# Patient Record
Sex: Female | Born: 1965 | Race: Black or African American | Hispanic: No | Smoking: Never smoker
Health system: Southern US, Community
[De-identification: ages and names within clinical notes are randomized; demographics above are authoritative.]

## PROBLEM LIST (undated history)

## (undated) DIAGNOSIS — D259 Leiomyoma of uterus, unspecified: Secondary | ICD-10-CM

## (undated) DIAGNOSIS — K7689 Other specified diseases of liver: Secondary | ICD-10-CM

## (undated) DIAGNOSIS — I10 Essential (primary) hypertension: Secondary | ICD-10-CM

## (undated) DIAGNOSIS — B009 Herpesviral infection, unspecified: Secondary | ICD-10-CM

## (undated) DIAGNOSIS — K449 Diaphragmatic hernia without obstruction or gangrene: Secondary | ICD-10-CM

## (undated) HISTORY — DX: Diaphragmatic hernia without obstruction or gangrene: K44.9

## (undated) HISTORY — DX: Other specified diseases of liver: K76.89

## (undated) HISTORY — PX: TUBAL LIGATION: SHX77

## (undated) HISTORY — DX: Leiomyoma of uterus, unspecified: D25.9

## (undated) HISTORY — PX: ABDOMINAL HYSTERECTOMY: SHX81

## (undated) HISTORY — DX: Herpesviral infection, unspecified: B00.9

---
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
## 1989-04-01 HISTORY — PX: CYST REMOVAL HAND: SHX6279

## 2005-07-14 ENCOUNTER — Emergency Department (HOSPITAL_COMMUNITY): Admission: EM | Admit: 2005-07-14 | Discharge: 2005-07-14 | Payer: Self-pay | Admitting: Emergency Medicine

## 2011-03-22 ENCOUNTER — Other Ambulatory Visit (HOSPITAL_COMMUNITY): Payer: Self-pay | Admitting: Family Medicine

## 2011-03-22 DIAGNOSIS — S199XXA Unspecified injury of neck, initial encounter: Secondary | ICD-10-CM

## 2011-04-05 ENCOUNTER — Ambulatory Visit (HOSPITAL_COMMUNITY)
Admission: RE | Admit: 2011-04-05 | Discharge: 2011-04-05 | Disposition: A | Discharge status: Home / Self Care | Payer: BC Managed Care – PPO | Source: Ambulatory Visit | Attending: Family Medicine | Admitting: Family Medicine

## 2011-04-05 DIAGNOSIS — S199XXA Unspecified injury of neck, initial encounter: Secondary | ICD-10-CM

## 2011-04-05 DIAGNOSIS — M542 Cervicalgia: Secondary | ICD-10-CM | POA: Insufficient documentation

## 2011-04-05 DIAGNOSIS — R209 Unspecified disturbances of skin sensation: Secondary | ICD-10-CM | POA: Insufficient documentation

## 2011-04-05 DIAGNOSIS — M5126 Other intervertebral disc displacement, lumbar region: Secondary | ICD-10-CM | POA: Insufficient documentation

## 2012-01-05 ENCOUNTER — Emergency Department (HOSPITAL_COMMUNITY)
Admission: EM | Admit: 2012-01-05 | Discharge: 2012-01-05 | Disposition: A | Discharge status: Home / Self Care | Payer: BC Managed Care – PPO | Attending: Emergency Medicine | Admitting: Emergency Medicine

## 2012-01-05 ENCOUNTER — Encounter (HOSPITAL_COMMUNITY): Payer: Self-pay | Admitting: Emergency Medicine

## 2012-01-05 ENCOUNTER — Emergency Department (HOSPITAL_COMMUNITY): Payer: BC Managed Care – PPO

## 2012-01-05 DIAGNOSIS — R079 Chest pain, unspecified: Secondary | ICD-10-CM | POA: Insufficient documentation

## 2012-01-05 DIAGNOSIS — Z79899 Other long term (current) drug therapy: Secondary | ICD-10-CM | POA: Insufficient documentation

## 2012-01-05 DIAGNOSIS — I10 Essential (primary) hypertension: Secondary | ICD-10-CM | POA: Insufficient documentation

## 2012-01-05 HISTORY — DX: Essential (primary) hypertension: I10

## 2012-01-05 LAB — CBC WITH DIFFERENTIAL/PLATELET
Basophils Absolute: 0 10*3/uL (ref 0.0–0.1)
Basophils Relative: 0 % (ref 0–1)
Eosinophils Absolute: 0 10*3/uL (ref 0.0–0.7)
Hemoglobin: 12.6 g/dL (ref 12.0–15.0)
MCH: 29.8 pg (ref 26.0–34.0)
MCHC: 36 g/dL (ref 30.0–36.0)
Monocytes Relative: 8 % (ref 3–12)
Neutro Abs: 6.2 10*3/uL (ref 1.7–7.7)
Neutrophils Relative %: 65 % (ref 43–77)
Platelets: 265 10*3/uL (ref 150–400)
RDW: 14.4 % (ref 11.5–15.5)

## 2012-01-05 LAB — COMPREHENSIVE METABOLIC PANEL
AST: 16 U/L (ref 0–37)
Albumin: 3.8 g/dL (ref 3.5–5.2)
Alkaline Phosphatase: 68 U/L (ref 39–117)
Chloride: 97 mEq/L (ref 96–112)
Potassium: 3.3 mEq/L — ABNORMAL LOW (ref 3.5–5.1)
Sodium: 134 mEq/L — ABNORMAL LOW (ref 135–145)
Total Bilirubin: 0.7 mg/dL (ref 0.3–1.2)
Total Protein: 7.8 g/dL (ref 6.0–8.3)

## 2012-01-05 LAB — D-DIMER, QUANTITATIVE: D-Dimer, Quant: 0.27 ug/mL-FEU (ref 0.00–0.48)

## 2012-01-05 MED ORDER — FAMOTIDINE 20 MG PO TABS: 20.0000 mg | ORAL_TABLET | Freq: Two times a day (BID) | ORAL | Status: DC

## 2012-01-05 NOTE — ED Provider Notes (Signed)
History       CSN: [id number]  Arrival date & time 01/05/12  6213   First MD Initiated Contact with Patient 01/05/12 [id number]      Chief Complaint  Patient presents with  . Chest Pain   History obtained directly from the patient, the patient states that she went to a class reunion last night, had eaten more food than usual, he did have some indigestion type pain in her chest which was mild and resolved spontaneously. She was awoken from sleep a short time ago with acute onset of mid chest pain, this was more intense than earlier in the evening, persistent, gradually improving until it had resolved and associated numbness of the right upper extremity. She had taken 2 aspirins prior to arrival and felt like his pain went away however she still has mild numbness of the right upper extremity. She does have a history of hypertension though she does not have diabetes cancer and does not smoke cigarettes. She has no history of coronary artery disease and states that she has seen a cardiologist twice in the past though she does not remember the specific tests that were performed. One time was for chest pain and one time was for a slow heart rate.  The patient denies history of venous thromboembolism, cancer, immobilization, travel, trauma, swelling, use of hormone therapy, leg swelling.  (Consider location/radiation/quality/duration/timing/severity/associated sxs/prior treatment) HPI  Past Medical History  Diagnosis Date  . Hypertension     History reviewed. No pertinent past surgical history.  History reviewed. No pertinent family history.  History  Substance Use Topics  . Smoking status: Never Smoker   . Smokeless tobacco: Not on file  . Alcohol Use: Yes     social     OB History    Grav Para Term Preterm Abortions TAB SAB Ect Mult Living                  Review of Systems  All other systems reviewed and are negative.    Allergies  Review of patient's allergies indicates no  known allergies.  Home Medications   Current Outpatient Rx  Name Route Sig Dispense Refill  . METOPROLOL TARTRATE 50 MG PO TABS Oral Take 50 mg by mouth 2 (two) times daily.      BP 142/89  Pulse 95  Temp 98.1 F (36.7 C) (Oral)  Resp 19  Ht 5\' 5"  (1.651 m)  Wt 200 lb (90.719 kg)  BMI 33.28 kg/m2  SpO2 97%  LMP 01/04/2012  Physical Exam  Nursing note and vitals reviewed. Constitutional: She appears well-developed and well-nourished. No distress.  HENT:  Head: Normocephalic and atraumatic.  Mouth/Throat: Oropharynx is clear and moist. No oropharyngeal exudate.  Eyes: Conjunctivae normal and EOM are normal. Pupils are equal, round, and reactive to light. Right eye exhibits no discharge. Left eye exhibits no discharge. No scleral icterus.  Neck: Normal range of motion. Neck supple. No JVD present. No thyromegaly present.  Cardiovascular: Normal rate, regular rhythm, normal heart sounds and intact distal pulses.  Exam reveals no gallop and no friction rub.   No murmur heard. Pulmonary/Chest: Effort normal and breath sounds normal. No respiratory distress. She has no wheezes. She has no rales.  Abdominal: Soft. Bowel sounds are normal. She exhibits no distension and no mass. There is no tenderness.  Musculoskeletal: Normal range of motion. She exhibits no edema and no tenderness.  Lymphadenopathy:    She has no cervical adenopathy.  Neurological: She is  alert. Coordination normal.       Normal sensation to light touch and pinprick of the right upper extremity, normal strength at the shoulder biceps forearm and grip of the right upper extremity. Gait is normal, speech is normal, memory is intact  Skin: Skin is warm and dry. No rash noted. No erythema.  Psychiatric: She has a normal mood and affect. Her behavior is normal.    ED Course  Procedures (including critical care time)  Labs Reviewed  CBC WITH DIFFERENTIAL - Abnormal; Notable for the following:    HCT 35.0 (*)     All  other components within normal limits  COMPREHENSIVE METABOLIC PANEL - Abnormal; Notable for the following:    Sodium 134 (*)     Potassium 3.3 (*)     All other components within normal limits  TROPONIN I  D-DIMER, QUANTITATIVE   Dg Chest 2 View  01/05/2012  *RADIOLOGY REPORT*  Clinical Data: Chest pain  CHEST - 2 VIEW  Comparison: None.  Findings: Lungs clear.  Heart size and pulmonary vascularity normal.  No effusion.  Visualized bones unremarkable.  IMPRESSION: No acute disease   Original Report Authenticated By: Thora Lance III, M.D.      1. Chest pain       MDM  Lungs and heart appear normal on exam, there is no signs of pulmonary embolism other than a slightly elevated heart rate of 100 on her EKG. There is no ischemic findings on EKG and she is pain-free. The pain was poorly described but also short lived and similar to the pain she had earlier in the evening after eating a large meal. Will obtain a d-dimer, labs, x-ray, reevaluate.  ED ECG REPORT  I personally interpreted this EKG   Date: 01/05/2012   Rate: 101  Rhythm: sinus tachycardia  QRS Axis: normal  Intervals: normal  ST/T Wave abnormalities: normal  Conduction Disutrbances:none  Narrative Interpretation:   Old EKG Reviewed: none available   The patient is asymptomatic, laboratory data reveals a normal d-dimer, normal troponin, normal blood counts and essentially normal electrolytes. Her chest x-ray is negative for any abnormal findings, the patient is low risk for both pulmonary embolism and acute coronary syndrome, suspect GI etiology, discharged home in improved condition.      Vida Roller, MD 01/05/12 [phone]

## 2012-01-05 NOTE — ED Notes (Signed)
Patient is comfortable at this time. 

## 2012-01-05 NOTE — ED Notes (Signed)
Patient states she does not need anything at this time. 

## 2012-01-05 NOTE — ED Notes (Signed)
Pt states chest pain x2 hours, states pain in right chest and numbness down right arm. Denies SOB or any other symptoms. Pt states to have taken 2 325mg  aspirins

## 2012-06-24 ENCOUNTER — Emergency Department (HOSPITAL_COMMUNITY): Payer: BC Managed Care – PPO

## 2012-06-24 ENCOUNTER — Inpatient Hospital Stay (HOSPITAL_COMMUNITY)
Admission: EM | Admit: 2012-06-24 | Discharge: 2012-06-26 | DRG: 179 | Disposition: A | Discharge status: Home / Self Care | Payer: BC Managed Care – PPO | Attending: Internal Medicine | Admitting: Internal Medicine

## 2012-06-24 ENCOUNTER — Encounter (HOSPITAL_COMMUNITY): Payer: Self-pay

## 2012-06-24 DIAGNOSIS — A0839 Other viral enteritis: Secondary | ICD-10-CM

## 2012-06-24 DIAGNOSIS — R1013 Epigastric pain: Secondary | ICD-10-CM

## 2012-06-24 DIAGNOSIS — R739 Hyperglycemia, unspecified: Secondary | ICD-10-CM | POA: Diagnosis present

## 2012-06-24 DIAGNOSIS — I10 Essential (primary) hypertension: Secondary | ICD-10-CM | POA: Diagnosis present

## 2012-06-24 DIAGNOSIS — K5000 Crohn's disease of small intestine without complications: Secondary | ICD-10-CM

## 2012-06-24 DIAGNOSIS — E876 Hypokalemia: Secondary | ICD-10-CM | POA: Diagnosis present

## 2012-06-24 DIAGNOSIS — E669 Obesity, unspecified: Secondary | ICD-10-CM

## 2012-06-24 DIAGNOSIS — R109 Unspecified abdominal pain: Secondary | ICD-10-CM | POA: Insufficient documentation

## 2012-06-24 DIAGNOSIS — B009 Herpesviral infection, unspecified: Secondary | ICD-10-CM | POA: Diagnosis present

## 2012-06-24 DIAGNOSIS — Z6833 Body mass index (BMI) 33.0-33.9, adult: Secondary | ICD-10-CM

## 2012-06-24 DIAGNOSIS — D72829 Elevated white blood cell count, unspecified: Secondary | ICD-10-CM | POA: Diagnosis present

## 2012-06-24 DIAGNOSIS — E162 Hypoglycemia, unspecified: Secondary | ICD-10-CM | POA: Diagnosis present

## 2012-06-24 DIAGNOSIS — N390 Urinary tract infection, site not specified: Secondary | ICD-10-CM | POA: Diagnosis present

## 2012-06-24 DIAGNOSIS — Z79899 Other long term (current) drug therapy: Secondary | ICD-10-CM

## 2012-06-24 LAB — COMPREHENSIVE METABOLIC PANEL
ALT: 10 U/L (ref 0–35)
AST: 13 U/L (ref 0–37)
AST: 12 U/L (ref 0–37)
Albumin: 3.7 g/dL (ref 3.5–5.2)
CO2: 26 mEq/L (ref 19–32)
Calcium: 9.3 mg/dL (ref 8.4–10.5)
Calcium: 9 mg/dL (ref 8.4–10.5)
Creatinine, Ser: 0.81 mg/dL (ref 0.50–1.10)
GFR calc non Af Amer: >90 mL/min (ref >90)
Sodium: 133 mEq/L — ABNORMAL LOW (ref 135–145)
Total Protein: 8 g/dL (ref 6.0–8.3)
Total Protein: 7.7 g/dL (ref 6.0–8.3)

## 2012-06-24 LAB — CBC WITH DIFFERENTIAL/PLATELET
Basophils Absolute: 0 10*3/uL (ref 0.0–0.1)
Basophils Relative: 0 % (ref 0–1)
Eosinophils Relative: 0 % (ref 0–5)
HCT: 35.9 % — ABNORMAL LOW (ref 36.0–46.0)
MCHC: 36.2 g/dL — ABNORMAL HIGH (ref 30.0–36.0)
MCV: 81.6 fL (ref 78.0–100.0)
Monocytes Absolute: 0.4 10*3/uL (ref 0.1–1.0)
RDW: 13.7 % (ref 11.5–15.5)

## 2012-06-24 LAB — URINALYSIS, ROUTINE W REFLEX MICROSCOPIC
Glucose, UA: NEGATIVE mg/dL
Protein, ur: NEGATIVE mg/dL
pH: 6.5 (ref 5.0–8.0)

## 2012-06-24 LAB — URINE MICROSCOPIC-ADD ON

## 2012-06-24 LAB — CBC
MCH: 28.6 pg (ref 26.0–34.0)
Platelets: 247 10*3/uL (ref 150–400)
RBC: 4.55 MIL/uL (ref 3.87–5.11)

## 2012-06-24 LAB — HEMOGLOBIN A1C: Hgb A1c MFr Bld: 5.2 % (ref <5.7)

## 2012-06-24 MED ORDER — CIPROFLOXACIN IN D5W 400 MG/200ML IV SOLN
400.0000 mg | Freq: Two times a day (BID) | INTRAVENOUS | Status: DC
  Administered 2012-06-24 – 2012-06-25 (×2): 400 mg via INTRAVENOUS
  Filled 2012-06-24 (×2): qty 200

## 2012-06-24 MED ORDER — SODIUM CHLORIDE 0.9 % IV SOLN: INTRAVENOUS | Status: DC

## 2012-06-24 MED ORDER — POTASSIUM CHLORIDE IN NACL 20-0.9 MEQ/L-% IV SOLN
INTRAVENOUS | Status: DC
  Administered 2012-06-24 – 2012-06-25 (×3): via INTRAVENOUS

## 2012-06-24 MED ORDER — HYDROMORPHONE HCL PF 1 MG/ML IJ SOLN
1.0000 mg | INTRAMUSCULAR | Status: DC | PRN
  Administered 2012-06-24: 1 mg via INTRAVENOUS
  Filled 2012-06-24: qty 1

## 2012-06-24 MED ORDER — SORBITOL 70 % SOLN: 30.0000 mL | Freq: Every day | Status: DC | PRN

## 2012-06-24 MED ORDER — ONDANSETRON HCL 4 MG/2ML IJ SOLN
4.0000 mg | Freq: Once | INTRAMUSCULAR | Status: AC
Start: 2012-06-24 — End: 2012-06-24
  Administered 2012-06-24: 4 mg via INTRAVENOUS

## 2012-06-24 MED ORDER — IOHEXOL 300 MG/ML  SOLN
100.0000 mL | Freq: Once | INTRAMUSCULAR | Status: AC | PRN
  Administered 2012-06-24: 100 mL via INTRAVENOUS

## 2012-06-24 MED ORDER — ONDANSETRON HCL 4 MG/2ML IJ SOLN
4.0000 mg | INTRAMUSCULAR | Status: DC | PRN
  Administered 2012-06-24 – 2012-06-25 (×4): 4 mg via INTRAVENOUS
  Filled 2012-06-24 (×4): qty 2

## 2012-06-24 MED ORDER — ONDANSETRON HCL 4 MG/2ML IJ SOLN
INTRAMUSCULAR | Status: AC
  Administered 2012-06-24: 4 mg via INTRAVENOUS
  Filled 2012-06-24: qty 2

## 2012-06-24 MED ORDER — MORPHINE SULFATE 4 MG/ML IJ SOLN
INTRAMUSCULAR | Status: AC
  Administered 2012-06-24: 4 mg via INTRAVENOUS
  Filled 2012-06-24: qty 1

## 2012-06-24 MED ORDER — ENOXAPARIN SODIUM 40 MG/0.4ML ~~LOC~~ SOLN
40.0000 mg | SUBCUTANEOUS | Status: DC
  Administered 2012-06-24 – 2012-06-26 (×3): 40 mg via SUBCUTANEOUS
  Filled 2012-06-24 (×3): qty 0.4

## 2012-06-24 MED ORDER — ACETAMINOPHEN 325 MG PO TABS
650.0000 mg | ORAL_TABLET | ORAL | Status: DC | PRN
  Administered 2012-06-25: 650 mg via ORAL
  Filled 2012-06-24: qty 2

## 2012-06-24 MED ORDER — METRONIDAZOLE IN NACL 5-0.79 MG/ML-% IV SOLN
500.0000 mg | Freq: Once | INTRAVENOUS | Status: AC
  Administered 2012-06-24: 500 mg via INTRAVENOUS
  Filled 2012-06-24: qty 100

## 2012-06-24 MED ORDER — FLEET ENEMA 7-19 GM/118ML RE ENEM: 1.0000 | ENEMA | Freq: Once | RECTAL | Status: AC | PRN

## 2012-06-24 MED ORDER — CIPROFLOXACIN IN D5W 400 MG/200ML IV SOLN
400.0000 mg | Freq: Once | INTRAVENOUS | Status: DC
  Administered 2012-06-24: 400 mg via INTRAVENOUS
  Filled 2012-06-24: qty 200

## 2012-06-24 MED ORDER — METOPROLOL TARTRATE 50 MG PO TABS
50.0000 mg | ORAL_TABLET | Freq: Two times a day (BID) | ORAL | Status: DC
  Administered 2012-06-24 – 2012-06-26 (×5): 50 mg via ORAL
  Filled 2012-06-24 (×5): qty 1

## 2012-06-24 MED ORDER — HYDROMORPHONE HCL PF 1 MG/ML IJ SOLN: 1.0000 mg | INTRAMUSCULAR | Status: DC | PRN

## 2012-06-24 MED ORDER — VALACYCLOVIR HCL 500 MG PO TABS
500.0000 mg | ORAL_TABLET | Freq: Two times a day (BID) | ORAL | Status: DC
  Administered 2012-06-24 – 2012-06-26 (×4): 500 mg via ORAL
  Filled 2012-06-24 (×6): qty 1

## 2012-06-24 MED ORDER — HYDROMORPHONE HCL PF 1 MG/ML IJ SOLN
0.5000 mg | INTRAMUSCULAR | Status: DC | PRN
  Administered 2012-06-24 – 2012-06-25 (×3): 0.5 mg via INTRAVENOUS
  Filled 2012-06-24 (×3): qty 1

## 2012-06-24 MED ORDER — IOHEXOL 300 MG/ML  SOLN
50.0000 mL | Freq: Once | INTRAMUSCULAR | Status: AC | PRN
  Administered 2012-06-24: 50 mL via ORAL

## 2012-06-24 MED ORDER — METRONIDAZOLE IN NACL 5-0.79 MG/ML-% IV SOLN
500.0000 mg | Freq: Three times a day (TID) | INTRAVENOUS | Status: DC
  Administered 2012-06-24 – 2012-06-25 (×4): 500 mg via INTRAVENOUS
  Filled 2012-06-24 (×4): qty 100

## 2012-06-24 MED ORDER — MORPHINE SULFATE 4 MG/ML IJ SOLN
4.0000 mg | Freq: Once | INTRAMUSCULAR | Status: AC
  Administered 2012-06-24: 4 mg via INTRAVENOUS

## 2012-06-24 MED ORDER — PANTOPRAZOLE SODIUM 40 MG PO TBEC
40.0000 mg | DELAYED_RELEASE_TABLET | Freq: Two times a day (BID) | ORAL | Status: DC
  Administered 2012-06-24 – 2012-06-26 (×5): 40 mg via ORAL
  Filled 2012-06-24 (×5): qty 1

## 2012-06-24 MED ORDER — ONDANSETRON HCL 4 MG/2ML IJ SOLN: 4.0000 mg | Freq: Three times a day (TID) | INTRAMUSCULAR | Status: DC | PRN

## 2012-06-24 MED ORDER — TRAZODONE HCL 50 MG PO TABS
50.0000 mg | ORAL_TABLET | Freq: Every evening | ORAL | Status: DC | PRN
  Administered 2012-06-25: 50 mg via ORAL
  Filled 2012-06-24: qty 1

## 2012-06-24 NOTE — Progress Notes (Signed)
Chart reviewed.  Subjective: Patient reports that pain continues, but that the pain medication and is close to strong. She vomited earlier today. Anti-emetic has helped. No stool. Feels bloated.  Objective: Vital signs in last 24 hours: Filed Vitals:   06/24/12 0500 06/24/12 0524 06/24/12 1000 06/24/12 1341  BP:  118/79 120/78 127/86  Pulse:  96 96 96  Temp:  98.9 F (37.2 C) 98.3 F (36.8 C) 98.5 F (36.9 C)  TempSrc:  Oral Oral Oral  Resp:  20 20 20   Height:  5\' 5"  (1.651 m)    Weight: 93.1 kg (205 lb 4 oz) 93.1 kg (205 lb 4 oz)    SpO2:  96% 98% 99%   Weight change:   Intake/Output Summary (Last 24 hours) at 06/24/12 1427 Last data filed at 06/24/12 1300  Gross per 24 hour  Intake  592.5 ml  Output      0 ml  Net  592.5 ml   Gen.: Groggy. Answers questions but is back to sleep easily. Lungs clear to auscultation bilaterally without wheeze rhonchi or rales Cardiovascular regular rate rhythm without murmurs gallops rubs Abdomen distended, soft, mildly tender Extremities no clubbing cyanosis or edema  Lab Results: Basic Metabolic Panel:  Recent Labs Lab 06/24/12 0057 06/24/12 0547  NA 135 133*  K 2.8* 3.5  CL 97 96  CO2 28 26  GLUCOSE 159* 142*  BUN 11 9  CREATININE 0.81 0.75  CALCIUM 9.3 9.0   Liver Function Tests:  Recent Labs Lab 06/24/12 0057 06/24/12 0547  AST 13 12  ALT 11 10  ALKPHOS 70 63  BILITOT 0.5 1.0  PROT 8.0 7.7  ALBUMIN 3.7 3.6    Recent Labs Lab 06/24/12 0057  LIPASE 14   No results found for this basename: AMMONIA,  in the last 168 hours CBC:  Recent Labs Lab 06/24/12 0057 06/24/12 0547  WBC 10.6* 11.5*  NEUTROABS 7.5  --   HGB 13.0 13.0  HCT 35.9* 37.1  MCV 81.6 81.5  PLT 265 247   Urinalysis:  Recent Labs Lab 06/24/12 0335  COLORURINE YELLOW  LABSPEC <1.005*  PHURINE 6.5  GLUCOSEU NEGATIVE  HGBUR MODERATE*  BILIRUBINUR NEGATIVE  KETONESUR NEGATIVE  PROTEINUR NEGATIVE  UROBILINOGEN 0.2  NITRITE  NEGATIVE  LEUKOCYTESUR TRACE*   Micro Results: Recent Results (from the past 240 hour(s))  URINE CULTURE     Status: None   Collection Time    06/24/12  3:35 AM      Result Value Range Status   Specimen Description URINE, RANDOM   Final   Special Requests NONE   Final   Colony Count PENDING   Incomplete   Culture PENDING   Incomplete   Report Status PENDING   Incomplete   Studies/Results: Ct Abdomen Pelvis W Contrast  06/24/2012  *RADIOLOGY REPORT*  Clinical Data: Generalized abdominal pain for 2 days.  CT ABDOMEN AND PELVIS WITH CONTRAST  Technique:  Multidetector CT imaging of the abdomen and pelvis was performed following the standard protocol during bolus administration of intravenous contrast.  Contrast: 50mL OMNIPAQUE IOHEXOL 300 MG/ML  SOLN, OMNIPAQUE IOHEXOL 300 MG/ML  SOLN  Comparison: None.  Findings: Lung Bases: Dependent atelectasis.  Liver:  Focal fatty infiltration adjacent to the falciform ligament.  Tiny low density lesion in the inferior right hepatic lobe, probably representing a tiny cyst.  Scattered other low attenuation areas are present.  Spleen:  Normal.  Gallbladder:  Normal.  Common bile duct:  Normal.  Pancreas:  Normal.  Adrenal glands:  Normal.  Kidneys:  Normal enhancement.  Normal delayed excretion of contrast.  Both ureters appear within normal limits.  Stomach:  Patulous distal thoracic esophagus.  Tiny hiatal hernia. No inflammatory changes of the stomach.  Small bowel:  Duodenum appears normal.  There is no small bowel obstruction proximally.  Small bowel is opacified with oral contrast to the level of the jejunum.  The ileum is fluid filled and demonstrates mucosal hyperenhancement, mild mesenteric fat stranding and borderline mural thickening.  The findings compatible with enteritis although this is nonspecific.  This can be associated with inflammatory bowel disease or enteric infection. Small amount of interloop free fluid is present.  Colon:   The  appendix is identified in the right lower quadrant. Proximal appendix has a air centrally.  The tip of the appendix is poorly visualized and terminates adjacent to the right ovary and loops of small bowel in the right lower quadrant (image number 57 series 2).  Acute appendicitis is considered unlikely.  There is however, difficult to completely exclude.  The ascending colon appears normal.  Transverse and descending colon normal.  Small amount of free fluid is present in the anatomic pelvis around the rectosigmoid.  Pelvic Genitourinary:  Urinary bladder appears normal.  Uterus and adnexa grossly appear within normal limits.  Possible small anterior fundal fibroid.  Bones:  No aggressive osseous lesions.  Vasculature: Normal opacification.  No acute vascular abnormality.  IMPRESSION: 1.  Enteritis of the terminal ileum.  Findings suggest either enteric infection or inflammatory bowel disease/Crohn's disease. No colonic involvement. 2.  Appendix appears normal proximally.  The distal tip is difficult to visualize.  Acute appendicitis is considered unlikely based on the appearance.  Repeat scanning in 8 hours could offer further characterization of distal small bowel and better define the appendix once oral contrast has progressed further. 3.  Tiny low density hepatic lesions likely representing cysts. 4.  Small hiatal hernia and patulous distal thoracic esophagus.   Original Report Authenticated By: Andreas Newport, M.D.    Scheduled Meds: . ciprofloxacin  400 mg Intravenous Q12H  . enoxaparin (LOVENOX) injection  40 mg Subcutaneous Q24H  . metoprolol  50 mg Oral BID  . metronidazole  500 mg Intravenous Q8H  . pantoprazole  40 mg Oral BID AC   Continuous Infusions: . 0.9 % NaCl with KCl 20 mEq / Craig 150 mL/hr at 06/24/12 0457   PRN Meds:.acetaminophen, HYDROmorphone (DILAUDID) injection, ondansetron (ZOFRAN) IV, sodium phosphate, sorbitol, traZODone Assessment/Plan: Principal Problem:   Ileitis,  terminal: Continue empiric Cipro and Flagyl. Will likely need colonoscopy. Await GIs recommendations. Patient reports that the pain medication is too sedating and would like to try cutting her dose back. Will decrease Dilaudid to 0.5 mg Active Problems:   Hypokalemia corrected   Essential hypertension, benign   UTI (urinary tract infection): Doubt much of a contributor here.   Obesity, unspecified   Hyperglycemia   LOS: 0 days   Christy Craig 06/24/2012, 2:27 PM

## 2012-06-24 NOTE — Care Management Note (Signed)
    Page 1 of 1   06/26/2012     1:30:20 PM   CARE MANAGEMENT NOTE 06/26/2012  Patient:  Christy Craig, Christy Craig   Account Number:  [id number]  Date Initiated:  06/24/2012  Documentation initiated by:  Rosemary Holms  Subjective/Objective Assessment:   Pt lives at home with a 16 and 45 yo. Works for [employer] and recieves medical care there. No HH needs identified or anticipated.     Action/Plan:   Anticipated DC Date:  06/26/2012   Anticipated DC Plan:  HOME/SELF CARE         Choice offered to / List presented to:             Status of service:  Completed, signed off Medicare Important Message given?   (If response is "NO", the following Medicare IM given date fields will be blank) Date Medicare IM given:   Date Additional Medicare IM given:    Discharge Disposition:    Per UR Regulation:    If discussed at Long Length of Stay Meetings, dates discussed:    Comments:  06/24/12 Rosemary Holms RN BSN CM

## 2012-06-24 NOTE — Consult Note (Signed)
Reason for Consult:abdominal pain Referring Physician: Hospitalist Services  Christy Craig is an 47 y.o. female.  HPI: Admitted last night thru the ED with abdominal pain. She was brought in by EMS. She says the pain was unbearable.  Abdominal pain started Sunday afternoon after eating [employer]. She actually thought she had "gas"  Pains. She c/o this am of epigastric and lower abdominal pain. There has been no fever. Shehad nausea and vomiting last night. She tells me her BMs have been normal. No melena or bright red rectal bleeding. Appetite is okay. She c/o of a burning sensation when she eats. At home, she takes Omeprazole on a prn basis for acid reflux.  She denies prior hx of this type of abdominal pain. No family hx of Crohn's disease. No hx of prior colonoscopy or EGD.  Past Medical History  Diagnosis Date  . Hypertension     History reviewed. No pertinent past surgical history.  Family History  Problem Relation Age of Onset  . Hypertension Sister   . Migraines Sister   . Ulcers Sister   . Kidney failure Father     Social History:  reports that she has never smoked. She does not have any smokeless tobacco history on file. She reports that  drinks alcohol. She reports that she does not use illicit drugs.  Allergies: No Known Allergies  Medications: I have reviewed the patient's current medications.  Results for orders placed during the hospital encounter of 06/24/12 (from the past 48 hour(s))  CBC WITH DIFFERENTIAL     Status: Abnormal   Collection Time    06/24/12 12:57 AM      Result Value Range   WBC 10.6 (*) 4.0 - 10.5 K/uL   RBC 4.40  3.87 - 5.11 MIL/uL   Hemoglobin 13.0  12.0 - 15.0 g/dL   HCT 16.1 (*) 09.6 - 04.5 %   MCV 81.6  78.0 - 100.0 fL   MCH 29.5  26.0 - 34.0 pg   MCHC 36.2 (*) 30.0 - 36.0 g/dL   RDW 40.9  81.1 - 91.4 %   Platelets 265  150 - 400 K/uL   Neutrophils Relative 70  43 - 77 %   Neutro Abs 7.5  1.7 - 7.7 K/uL   Lymphocytes Relative 26   12 - 46 %   Lymphs Abs 2.7  0.7 - 4.0 K/uL   Monocytes Relative 4  3 - 12 %   Monocytes Absolute 0.4  0.1 - 1.0 K/uL   Eosinophils Relative 0  0 - 5 %   Eosinophils Absolute 0.0  0.0 - 0.7 K/uL   Basophils Relative 0  0 - 1 %   Basophils Absolute 0.0  0.0 - 0.1 K/uL  COMPREHENSIVE METABOLIC PANEL     Status: Abnormal   Collection Time    06/24/12 12:57 AM      Result Value Range   Sodium 135  135 - 145 mEq/L   Potassium 2.8 (*) 3.5 - 5.1 mEq/L   Chloride 97  96 - 112 mEq/L   CO2 28  19 - 32 mEq/L   Glucose, Bld 159 (*) 70 - 99 mg/dL   BUN 11  6 - 23 mg/dL   Creatinine, Ser 7.82  0.50 - 1.10 mg/dL   Calcium 9.3  8.4 - 95.6 mg/dL   Total Protein 8.0  6.0 - 8.3 g/dL   Albumin 3.7  3.5 - 5.2 g/dL   AST 13  0 - 37 U/L  ALT 11  0 - 35 U/L   Alkaline Phosphatase 70  39 - 117 U/L   Total Bilirubin 0.5  0.3 - 1.2 mg/dL   GFR calc non Af Amer 87 (*) >90 mL/min   GFR calc Af Amer >90  >90 mL/min   Comment:            The eGFR has been calculated     using the CKD EPI equation.     This calculation has not been     validated in all clinical     situations.     eGFR's persistently     <90 mL/min signify     possible Chronic Kidney Disease.  LIPASE, BLOOD     Status: None   Collection Time    06/24/12 12:57 AM      Result Value Range   Lipase 14  11 - 59 U/L  URINALYSIS, ROUTINE W REFLEX MICROSCOPIC     Status: Abnormal   Collection Time    06/24/12  3:35 AM      Result Value Range   Color, Urine YELLOW  YELLOW   APPearance CLEAR  CLEAR   Specific Gravity, Urine <1.005 (*) 1.005 - 1.030   pH 6.5  5.0 - 8.0   Glucose, UA NEGATIVE  NEGATIVE mg/dL   Hgb urine dipstick MODERATE (*) NEGATIVE   Bilirubin Urine NEGATIVE  NEGATIVE   Ketones, ur NEGATIVE  NEGATIVE mg/dL   Protein, ur NEGATIVE  NEGATIVE mg/dL   Urobilinogen, UA 0.2  0.0 - 1.0 mg/dL   Nitrite NEGATIVE  NEGATIVE   Leukocytes, UA TRACE (*) NEGATIVE  URINE MICROSCOPIC-ADD ON     Status: Abnormal   Collection Time     06/24/12  3:35 AM      Result Value Range   Squamous Epithelial / LPF FEW (*) RARE   WBC, UA TOO NUMEROUS TO COUNT  <3 WBC/hpf   RBC / HPF 0-2  <3 RBC/hpf   Bacteria, UA FEW (*) RARE  URINE CULTURE     Status: None   Collection Time    06/24/12  3:35 AM      Result Value Range   Specimen Description URINE, RANDOM     Special Requests NONE     Colony Count PENDING     Culture PENDING     Report Status PENDING    CBC     Status: Abnormal   Collection Time    06/24/12  5:47 AM      Result Value Range   WBC 11.5 (*) 4.0 - 10.5 K/uL   RBC 4.55  3.87 - 5.11 MIL/uL   Hemoglobin 13.0  12.0 - 15.0 g/dL   HCT 81.1  91.4 - 78.2 %   MCV 81.5  78.0 - 100.0 fL   MCH 28.6  26.0 - 34.0 pg   MCHC 35.0  30.0 - 36.0 g/dL   RDW 95.6  21.3 - 08.6 %   Platelets 247  150 - 400 K/uL  COMPREHENSIVE METABOLIC PANEL     Status: Abnormal   Collection Time    06/24/12  5:47 AM      Result Value Range   Sodium 133 (*) 135 - 145 mEq/L   Potassium 3.5  3.5 - 5.1 mEq/L   Comment: DELTA CHECK NOTED   Chloride 96  96 - 112 mEq/L   CO2 26  19 - 32 mEq/L   Glucose, Bld 142 (*) 70 - 99 mg/dL   BUN  9  6 - 23 mg/dL   Creatinine, Ser 7.82  0.50 - 1.10 mg/dL   Calcium 9.0  8.4 - 95.6 mg/dL   Total Protein 7.7  6.0 - 8.3 g/dL   Albumin 3.6  3.5 - 5.2 g/dL   AST 12  0 - 37 U/L   ALT 10  0 - 35 U/L   Alkaline Phosphatase 63  39 - 117 U/L   Total Bilirubin 1.0  0.3 - 1.2 mg/dL   GFR calc non Af Amer >90  >90 mL/min   GFR calc Af Amer >90  >90 mL/min   Comment:            The eGFR has been calculated     using the CKD EPI equation.     This calculation has not been     validated in all clinical     situations.     eGFR's persistently     <90 mL/min signify     possible Chronic Kidney Disease.    Ct Abdomen Pelvis W Contrast  06/24/2012  *RADIOLOGY REPORT*  Clinical Data: Generalized abdominal pain for 2 days.  CT ABDOMEN AND PELVIS WITH CONTRAST  Technique:  Multidetector CT imaging of the abdomen and  pelvis was performed following the standard protocol during bolus administration of intravenous contrast.  Contrast: 50mL OMNIPAQUE IOHEXOL 300 MG/ML  SOLN, OMNIPAQUE IOHEXOL 300 MG/ML  SOLN  Comparison: None.  Findings: Lung Bases: Dependent atelectasis.  Liver:  Focal fatty infiltration adjacent to the falciform ligament.  Tiny low density lesion in the inferior right hepatic lobe, probably representing a tiny cyst.  Scattered other low attenuation areas are present.  Spleen:  Normal.  Gallbladder:  Normal.  Common bile duct:  Normal.  Pancreas:  Normal.  Adrenal glands:  Normal.  Kidneys:  Normal enhancement.  Normal delayed excretion of contrast.  Both ureters appear within normal limits.  Stomach:  Patulous distal thoracic esophagus.  Tiny hiatal hernia. No inflammatory changes of the stomach.  Small bowel:  Duodenum appears normal.  There is no small bowel obstruction proximally.  Small bowel is opacified with oral contrast to the level of the jejunum.  The ileum is fluid filled and demonstrates mucosal hyperenhancement, mild mesenteric fat stranding and borderline mural thickening.  The findings compatible with enteritis although this is nonspecific.  This can be associated with inflammatory bowel disease or enteric infection. Small amount of interloop free fluid is present.  Colon:   The appendix is identified in the right lower quadrant. Proximal appendix has a air centrally.  The tip of the appendix is poorly visualized and terminates adjacent to the right ovary and loops of small bowel in the right lower quadrant (image number 57 series 2).  Acute appendicitis is considered unlikely.  There is however, difficult to completely exclude.  The ascending colon appears normal.  Transverse and descending colon normal.  Small amount of free fluid is present in the anatomic pelvis around the rectosigmoid.  Pelvic Genitourinary:  Urinary bladder appears normal.  Uterus and adnexa grossly appear within normal  limits.  Possible small anterior fundal fibroid.  Bones:  No aggressive osseous lesions.  Vasculature: Normal opacification.  No acute vascular abnormality.  IMPRESSION: 1.  Enteritis of the terminal ileum.  Findings suggest either enteric infection or inflammatory bowel disease/Crohn's disease. No colonic involvement. 2.  Appendix appears normal proximally.  The distal tip is difficult to visualize.  Acute appendicitis is considered unlikely based on the appearance.  Repeat scanning in 8 hours could offer further characterization of distal small bowel and better define the appendix once oral contrast has progressed further. 3.  Tiny low density hepatic lesions likely representing cysts. 4.  Small hiatal hernia and patulous distal thoracic esophagus.   Original Report Authenticated By: Andreas Newport, M.D.     ROS Blood pressure 118/79, pulse 96, temperature 98.9 F (37.2 C), temperature source Oral, resp. rate 20, height 5\' 5"  (1.651 m), weight 205 lb 4 oz (93.1 kg), last menstrual period 06/10/2012, SpO2 96.00%. Physical Exam Alert and oriented. Skin warm and dry. Oral mucosa is moist.   . Sclera anicteric, conjunctivae is pink. Thyroid not enlarged. No cervical lymphadenopathy. Lungs clear. Heart regular rate and rhythm.  Abdomen is soft. There is guarding.   No abdominal masses felt. Diffuse abdominal pain. BS are sluggish.  No edema to lower extremities.    Assessment/Plan:Abdominal pian. Enteritis of the terminal ileum per CT. Possible inflammatory bowel disease. Agree with Cipro and Flagyl. I will discuss with Dr. Karilyn Cota.  Agree with clear liquids.   SETZER,TERRI W 06/24/2012, 8:45 AM    GI attending note; Patient interviewed and examined. Abdominopelvic CT reviewed with Dr. Charlett Nose and also shared with patient. Patient presents with acute onset of severe abdominal pain primarily in upper and lower abdomen. She's been having these episodes since she was 47 years old and last episode was 2  years ago. This is the worst episode she has ever had. None of these episodes been associated with nausea vomiting or diarrhea. There is no history of recent antibiotic use. 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 This information to pass to Dr. Lendell Caprice and she has already addressed it. Abdominal exam reveals full abdomen with normal bowel sounds soft abdomen with moderate tenderness in hypogastric region and epigastrium and mild tenderness at RLQ. Abdominopelvic CT shows enhancement to loops of distal small bowel. Elongated uterus. Assessment; Patient's upper and lower abdominal pain may or may not be secondary to enteritis. She does not have other symptoms that one would expect to see with enteritis. Since she has CT evidence of enteritis would be reasonable to pretreat her with antibiotics as you are doing. If she does develop diarrhea we'll do stool studies. She was that he needed a followup CT in 4-6 weeks and if these changes persist she may need small bowel given capsule study.  patient will also need gynecologic evaluation which can be done on outpatient basis.

## 2012-06-24 NOTE — ED Provider Notes (Signed)
History     CSN: [id number]  Arrival date & time 06/24/12  0026   First MD Initiated Contact with Patient 06/24/12 0051      Chief Complaint  Patient presents with  . Abdominal Pain    (Consider location/radiation/quality/duration/timing/severity/associated sxs/prior treatment) HPI Comments: Patient presents with a two day history of generalized abd pain that significantly worsened this evening.  She is nauseated and has vomited.  She denies fevers or chills.  No urinary complaints.  No history of prior abdominal surgeries.  Patient is a 47 y.o. female presenting with abdominal pain. The history is provided by the patient.  Abdominal Pain Pain location:  Generalized Pain quality: stabbing   Pain radiates to:  Does not radiate Pain severity:  Severe Onset quality:  Gradual Duration:  2 days Timing:  Constant Progression:  Worsening Chronicity:  New Context: not alcohol use and not diet changes   Relieved by:  Nothing Ineffective treatments:  None tried   Past Medical History  Diagnosis Date  . Hypertension     History reviewed. No pertinent past surgical history.  No family history on file.  History  Substance Use Topics  . Smoking status: Never Smoker   . Smokeless tobacco: Not on file  . Alcohol Use: Yes     Comment: social     OB History   Grav Para Term Preterm Abortions TAB SAB Ect Mult Living                  Review of Systems  Gastrointestinal: Positive for abdominal pain.  All other systems reviewed and are negative.    Allergies  Review of patient's allergies indicates no known allergies.  Home Medications   Current Outpatient Rx  Name  Route  Sig  Dispense  Refill  . famotidine (PEPCID) 20 MG tablet   Oral   Take 1 tablet (20 mg total) by mouth 2 (two) times daily.   30 tablet   0   . metoprolol (LOPRESSOR) 50 MG tablet   Oral   Take 50 mg by mouth 2 (two) times daily.           LMP 06/10/2012  Physical Exam  Nursing note  and vitals reviewed. Constitutional: She is oriented to person, place, and time. She appears well-developed and well-nourished.  Patient appears very uncomfortable.  She is moaning and writhing.  HENT:  Head: Normocephalic and atraumatic.  Neck: Normal range of motion. Neck supple.  Cardiovascular: Normal rate and regular rhythm.  Exam reveals no gallop and no friction rub.   No murmur heard. Pulmonary/Chest: Effort normal and breath sounds normal. No respiratory distress. She has no wheezes.  Abdominal: Soft. Bowel sounds are normal. She exhibits no distension.  There is ttp in all four quadrants.  There is no rebound or guarding.   Musculoskeletal: Normal range of motion.  Neurological: She is alert and oriented to person, place, and time.  Skin: Skin is warm and dry.    ED Course  Procedures (including critical care time)  Labs Reviewed  CBC WITH DIFFERENTIAL  COMPREHENSIVE METABOLIC PANEL  LIPASE, BLOOD  URINALYSIS, ROUTINE W REFLEX MICROSCOPIC   No results found.   No diagnosis found.    MDM  The patient presents here with severe abdominal pain, nausea, and vomiting.  The workup reveals an elevated wbc and ct that shows terminal ileitis, the etiology likely infectious or inflammatory.  She was given cipro and flagyl and will be admitted to the hospitalist  service.  Dr. Orvan Falconer notified and agrees to admit.        Geoffery Lyons, MD 06/24/12 [phone]

## 2012-06-24 NOTE — Progress Notes (Signed)
UR Chart Review Completed  

## 2012-06-24 NOTE — Progress Notes (Signed)
ANTIBIOTIC CONSULT NOTE - INITIAL  Pharmacy Consult for Cipro Indication: UTI  No Known Allergies  Patient Measurements: Height: 5\' 5"  (165.1 cm) Weight: 205 lb 4 oz (93.1 kg) IBW/kg (Calculated) : 57  Vital Signs: Temp: 98.9 F (37.2 C) (03/26 0524) Temp src: Oral (03/26 0524) BP: 118/79 mmHg (03/26 0524) Pulse Rate: 96 (03/26 0524) Intake/Output from previous day: 03/25 0701 - 03/26 0700 In: 532.5 [I.V.:232.5; IV Piggyback:300] Out: -  Intake/Output from this shift:    Labs:  Recent Labs  06/24/12 0057 06/24/12 0547  WBC 10.6* 11.5*  HGB 13.0 13.0  PLT 265 247  CREATININE 0.81 0.75   Estimated Creatinine Clearance: 101.2 ml/min (by C-G formula based on Cr of 0.75). No results found for this basename: VANCOTROUGH, Leodis Binet, VANCORANDOM, GENTTROUGH, GENTPEAK, GENTRANDOM, TOBRATROUGH, TOBRAPEAK, TOBRARND, AMIKACINPEAK, AMIKACINTROU, AMIKACIN,  in the last 72 hours   Microbiology: Recent Results (from the past 720 hour(s))  URINE CULTURE     Status: None   Collection Time    06/24/12  3:35 AM      Result Value Range Status   Specimen Description URINE, RANDOM   Final   Special Requests NONE   Final   Colony Count PENDING   Incomplete   Culture PENDING   Incomplete   Report Status PENDING   Incomplete    Medical History: Past Medical History  Diagnosis Date  . Hypertension     Medications:  Scheduled:  . ciprofloxacin  400 mg Intravenous Q12H  . enoxaparin (LOVENOX) injection  40 mg Subcutaneous Q24H  . metoprolol  50 mg Oral BID  . [COMPLETED] metronidazole  500 mg Intravenous Once  . metronidazole  500 mg Intravenous Q8H  . [COMPLETED]  morphine injection  4 mg Intravenous Once  . [COMPLETED] ondansetron (ZOFRAN) IV  4 mg Intravenous Once  . pantoprazole  40 mg Oral BID AC  . [DISCONTINUED] sodium chloride   Intravenous STAT  . [COMPLETED] ciprofloxacin  400 mg Intravenous Once   Assessment: 47 yo F admitted with terminal ileitis and possible  UTI starting on empiric Flagyl & Cipro pending GI evaluation & cx data.  Her WBC is slightly elevated but she remains afebrile.  Renal function is normal  Goal of Therapy:  Eradicate infection.  Plan:  Cipro 400mg  IV Q12h Change to oral as appropriate Monitor renal function, cx data & patient progress Duration of therapy per MD  Elson Clan 06/24/2012,8:39 AM

## 2012-06-24 NOTE — ED Notes (Signed)
Pt via EMS with complaints of abdominal pain for 2 days. Denies vomiting or diarrhea
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
## 2012-06-25 LAB — URINE CULTURE: Colony Count: 40000

## 2012-06-25 MED ORDER — METRONIDAZOLE 500 MG PO TABS
500.0000 mg | ORAL_TABLET | Freq: Three times a day (TID) | ORAL | Status: DC
  Administered 2012-06-25 – 2012-06-26 (×2): 500 mg via ORAL
  Filled 2012-06-25 (×2): qty 1

## 2012-06-25 MED ORDER — OXYCODONE HCL 5 MG PO TABS
5.0000 mg | ORAL_TABLET | ORAL | Status: DC | PRN
  Administered 2012-06-25: 5 mg via ORAL
  Filled 2012-06-25: qty 1

## 2012-06-25 MED ORDER — CIPROFLOXACIN HCL 250 MG PO TABS
500.0000 mg | ORAL_TABLET | Freq: Two times a day (BID) | ORAL | Status: DC
  Administered 2012-06-25 – 2012-06-26 (×3): 500 mg via ORAL
  Filled 2012-06-25 (×3): qty 2

## 2012-06-25 NOTE — Progress Notes (Signed)
Subjective: This lady feels much improved. She still has abdominal pain but she has been able to tolerate a diet this morning without any vomiting. She has had no fever.           Physical Exam: Blood pressure 130/84, pulse 92, temperature 99.9 F (37.7 C), temperature source Oral, resp. rate 20, height 5\' 5"  (1.651 m), weight 91.3 kg (201 lb 4.5 oz), last menstrual period 06/10/2012, SpO2 94.00%. She looks systemically well. She is not toxic or septic. Her abdomen is actually tender more so than the right lower quadrant. Bowel sounds are somewhat scanty. She is alert and orientated. Lung fields are clear.   Investigations:  Recent Results (from the past 240 hour(s))  URINE CULTURE     Status: None   Collection Time    06/24/12  3:35 AM      Result Value Range Status   Specimen Description URINE, CLEAN CATCH   Final   Special Requests NONE   Final   Culture  Setup Time 06/24/2012 10:45   Final   Colony Count 40,000 COLONIES/ML   Final   Culture     Final   Value: Multiple bacterial morphotypes present, none predominant. Suggest appropriate recollection if clinically indicated.   Report Status 06/25/2012 FINAL   Final     Basic Metabolic Panel:  Recent Labs  16/10/96 0057 06/24/12 0547  NA 135 133*  K 2.8* 3.5  CL 97 96  CO2 28 26  GLUCOSE 159* 142*  BUN 11 9  CREATININE 0.81 0.75  CALCIUM 9.3 9.0   Liver Function Tests:  Recent Labs  06/24/12 0057 06/24/12 0547  AST 13 12  ALT 11 10  ALKPHOS 70 63  BILITOT 0.5 1.0  PROT 8.0 7.7  ALBUMIN 3.7 3.6     CBC:  Recent Labs  06/24/12 0057 06/24/12 0547  WBC 10.6* 11.5*  NEUTROABS 7.5  --   HGB 13.0 13.0  HCT 35.9* 37.1  MCV 81.6 81.5  PLT 265 247    Ct Abdomen Pelvis W Contrast  06/24/2012  *RADIOLOGY REPORT*  Clinical Data: Generalized abdominal pain for 2 days.  CT ABDOMEN AND PELVIS WITH CONTRAST  Technique:  Multidetector CT imaging of the abdomen and pelvis was performed following  the standard protocol during bolus administration of intravenous contrast.  Contrast: 50mL OMNIPAQUE IOHEXOL 300 MG/ML  SOLN, OMNIPAQUE IOHEXOL 300 MG/ML  SOLN  Comparison: None.  Findings: Lung Bases: Dependent atelectasis.  Liver:  Focal fatty infiltration adjacent to the falciform ligament.  Tiny low density lesion in the inferior right hepatic lobe, probably representing a tiny cyst.  Scattered other low attenuation areas are present.  Spleen:  Normal.  Gallbladder:  Normal.  Common bile duct:  Normal.  Pancreas:  Normal.  Adrenal glands:  Normal.  Kidneys:  Normal enhancement.  Normal delayed excretion of contrast.  Both ureters appear within normal limits.  Stomach:  Patulous distal thoracic esophagus.  Tiny hiatal hernia. No inflammatory changes of the stomach.  Small bowel:  Duodenum appears normal.  There is no small bowel obstruction proximally.  Small bowel is opacified with oral contrast to the level of the jejunum.  The ileum is fluid filled and demonstrates mucosal hyperenhancement, mild mesenteric fat stranding and borderline mural thickening.  The findings compatible with enteritis although this is nonspecific.  This can be associated with inflammatory bowel disease or enteric infection. Small amount of interloop free fluid is present.  Colon:  The appendix is identified in the right lower quadrant. Proximal appendix has a air centrally.  The tip of the appendix is poorly visualized and terminates adjacent to the right ovary and loops of small bowel in the right lower quadrant (image number [id number] series 2).  Acute appendicitis is considered unlikely.  There is however, difficult to completely exclude.  The ascending colon appears normal.  Transverse and descending colon normal.  Small amount of free fluid is present in the anatomic pelvis around the rectosigmoid.  Pelvic Genitourinary:  Urinary bladder appears normal.  Uterus and adnexa grossly appear within normal limits.  Possible small  anterior fundal fibroid.  Bones:  No aggressive osseous lesions.  Vasculature: Normal opacification.  No acute vascular abnormality.  IMPRESSION: 1.  Enteritis of the terminal ileum.  Findings suggest either enteric infection or inflammatory bowel disease/Crohn's disease. No colonic involvement. 2.  Appendix appears normal proximally.  The distal tip is difficult to visualize.  Acute appendicitis is considered unlikely based on the appearance.  Repeat scanning in 8 hours could offer further characterization of distal small bowel and better define the appendix once oral contrast has progressed further. 3.  Tiny low density hepatic lesions likely representing cysts. 4.  Small hiatal hernia and patulous distal thoracic esophagus.   Original Report Authenticated By: Andreas Newport, M.D.       Medications: I have reviewed the patient's current medications.  Impression: 1. Ileitis, unclear etiology. Improving. 2. Hypertension. 3. Possible gynecological pain.    Plan: 1. Advance diet. 2. Decrease IV fluids. 3. Discontinue IV opioids and start oral opioids when necessary. 4. Convert antibiotics to oral. 5. Possible discharge home tomorrow.     LOS: 1 day   Wilson Singer Pager [phone]  06/25/2012, 10:41 AM

## 2012-06-25 NOTE — Progress Notes (Signed)
Subjective; Patient feels better. She is having less pain which is mainly in hypogastric region. Pain is increased on urination. She had one loose stool today. She still has some discomfort in epigastric region. Upper and lower [place] pain seems to get worse with coughing. Objective; BP 116/77  Pulse 89  Temp(Src) 98.7 F (37.1 C) (Oral)  Resp 20  Ht 5\' 5"  (1.651 m)  Wt 201 lb 4.5 oz (91.3 kg)  BMI 33.49 kg/m2  SpO2 95%  LMP 06/10/2012 Assessment; Acute abdominal pain felt to be secondary to acute enteritis based on CT findings. Patient tolerating liquids. She remains on empiric antibiotic therapy but not taking by mouth. Recommendations; Will order stool studies since she is passing liquid stools.

## 2012-06-26 DIAGNOSIS — E669 Obesity, unspecified: Secondary | ICD-10-CM

## 2012-06-26 LAB — COMPREHENSIVE METABOLIC PANEL
Albumin: 2.8 g/dL — ABNORMAL LOW (ref 3.5–5.2)
Alkaline Phosphatase: 86 U/L (ref 39–117)
BUN: 4 mg/dL — ABNORMAL LOW (ref 6–23)
CO2: 24 mEq/L (ref 19–32)
Chloride: 104 mEq/L (ref 96–112)
Creatinine, Ser: 0.81 mg/dL (ref 0.50–1.10)
GFR calc Af Amer: >90 mL/min (ref >90)
Sodium: 138 mEq/L (ref 135–145)
Total Bilirubin: 1 mg/dL (ref 0.3–1.2)
Total Protein: 6.8 g/dL (ref 6.0–8.3)

## 2012-06-26 LAB — CBC
MCH: 29.2 pg (ref 26.0–34.0)
Platelets: 227 10*3/uL (ref 150–400)
RBC: 3.7 MIL/uL — ABNORMAL LOW (ref 3.87–5.11)
WBC: 10.8 10*3/uL — ABNORMAL HIGH (ref 4.0–10.5)

## 2012-06-26 MED ORDER — METRONIDAZOLE 500 MG PO TABS: 500.0000 mg | ORAL_TABLET | Freq: Three times a day (TID) | ORAL | Status: DC

## 2012-06-26 MED ORDER — OXYCODONE HCL 5 MG PO TABS: 5.0000 mg | ORAL_TABLET | ORAL | Status: DC | PRN

## 2012-06-26 MED ORDER — CIPROFLOXACIN HCL 500 MG PO TABS: 500.0000 mg | ORAL_TABLET | Freq: Two times a day (BID) | ORAL | Status: DC

## 2012-06-26 MED ORDER — VALACYCLOVIR HCL 500 MG PO TABS: 500.0000 mg | ORAL_TABLET | Freq: Two times a day (BID) | ORAL | Status: DC

## 2012-06-26 NOTE — Progress Notes (Signed)
Patient ID: Christy Craig, female   DOB: [birth year], 47 y.o.   MRN: [id number] Feels better. Says she is 60%. No abdominal pain now.  BM yesterday.  Will f/u in office in 2 weeks. Will repeat CT scan of the abdomen in 4 weeks. Filed Vitals:   06/25/12 1943 06/25/12 2140 06/26/12 0104 06/26/12 0406  BP: 127/86 138/86  119/76  Pulse:  98  82  Temp: 99.2 F (37.3 C) 100.6 F (38.1 C) 99 F (37.2 C) 98.9 F (37.2 C)  TempSrc: Oral Oral Oral Oral  Resp: 20 20  20   Height:      Weight:      SpO2: 95% 94%  97%

## 2012-06-26 NOTE — Discharge Summary (Signed)
Physician Discharge Summary  Christy Craig BMW:[id number] DOB: 06-14-1965 DOA: 06/24/2012  PCP: Calla Kicks, MD  Admit date: 06/24/2012 Discharge date: 06/26/2012  Time spent: Greater than 30 minutes  Recommendations for Outpatient Follow-up:  1. Followup with Dr. Karilyn Cota, gastroenterology in 4 weeks. 2. Followup with Dr. Emelda Fear, OB/GYN in couple weeks.  Discharge Diagnoses: 1. Terminal ileitis, improving.  2. 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 Antiviral started. 3. Benign essential hypertension, controlled. 4. Obesity.   Discharge Condition: Stable and improving.  Diet recommendation: Regular.  Filed Weights   06/24/12 0500 06/24/12 0524 06/25/12 0533  Weight: 93.1 kg (205 lb 4 oz) 93.1 kg (205 lb 4 oz) 91.3 kg (201 lb 4.5 oz)    History of present illness:  This very pleasant 47 year old lady presented to the hospital with symptoms of abdominal pain for 3 days. Please see initial history as outlined below: Christy Craig is an 47 y.o. female. 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 Describes the pain as 10 out of 10 when it comes and since yesterday pain has been unrelenting so she started en route to the hospital. felt so sick on the way that she stopped and called the ambulance and arrived to the emergency room by ambulance.  Pain was associated with nausea, but no vomiting or diarrhea. It is aggravated by breathing, but not related to passage of urine or stool.  In the emergency room she vomited twice for the first time since her illness, and a CT scan of the abdomen is suggestive of terminal ileitis.  Hospital Course:  Include the abnormal CT scan findings an abnormal examination, she was started on intravenous antibiotics with ciprofloxacin and  metronidazole. She is improved with this. She also complained of vagina discharge and she was started on Valtrex. She was seen by Dr. Karilyn Cota, gastroenterology, who was not entirely convinced of the history and felt that she would need repeat CT scan in 4 weeks' time. She may possible further investigations for inflammatory bowel disease at a later date. She has been able to tolerate oral antibiotics and oral analgesia without vomiting. She stable for discharge now.  Procedures:  None.   Consultations:  Gastroenterology, Dr. Karilyn Cota  Discharge Exam: Filed Vitals:   06/25/12 1943 06/25/12 2140 06/26/12 0104 06/26/12 0406  BP: 127/86 138/86  119/76  Pulse:  98  82  Temp: 99.2 F (37.3 C) 100.6 F (38.1 C) 99 F (37.2 C) 98.9 F (37.2 C)  TempSrc: Oral Oral Oral Oral  Resp: 20 20  20   Height:      Weight:      SpO2: 95% 94%  97%    General: She looks systemically well. She is nontoxic or septic. Cardiovascular: Heart sounds are present and normal without murmurs or gallop rhythm. Respiratory: Lung fields are clear. Abdomen is soft and no longer tender.  Discharge Instructions  Discharge Orders   Future Orders Complete By Expires     Diet - low sodium heart healthy  As directed     Increase activity slowly  As directed         Medication List    TAKE these medications       ciprofloxacin 500 MG tablet  Commonly known as:  CIPRO  Take 1 tablet (500 mg total) by mouth 2 (two) times daily.  famotidine 20 MG tablet  Commonly known as:  PEPCID  Take 20 mg by mouth daily as needed for heartburn.     hydrochlorothiazide 25 MG tablet  Commonly known as:  HYDRODIURIL  Take 25 mg by mouth daily.     metoprolol 50 MG tablet  Commonly known as:  LOPRESSOR  Take 50 mg by mouth 2 (two) times daily.     metroNIDAZOLE 500 MG tablet  Commonly known as:  FLAGYL  Take 1 tablet (500 mg total) by mouth every 8 (eight) hours.     oxyCODONE 5 MG immediate release tablet   Commonly known as:  Oxy IR/ROXICODONE  Take 1 tablet (5 mg total) by mouth every 3 (three) hours as needed.     valACYclovir 500 MG tablet  Commonly known as:  VALTREX  Take 1 tablet (500 mg total) by mouth 2 (two) times daily.           Follow-up Information   Follow up with Tilda Burrow, MD. Schedule an appointment as soon as possible for a visit in 2 weeks.   Contact information:   [address] [place] [address] [phone]       Follow up with REHMAN,NAJEEB U, MD. Schedule an appointment as soon as possible for a visit in 4 weeks.   Contact information:   [address], [address] [place] [address] [phone]        The results of significant diagnostics from this hospitalization (including imaging, microbiology, ancillary and laboratory) are listed below for reference.    Significant Diagnostic Studies: Ct Abdomen Pelvis W Contrast  06/24/2012  *RADIOLOGY REPORT*  Clinical Data: Generalized abdominal pain for 2 days.  CT ABDOMEN AND PELVIS WITH CONTRAST  Technique:  Multidetector CT imaging of the abdomen and pelvis was performed following the standard protocol during bolus administration of intravenous contrast.  Contrast: 50mL OMNIPAQUE IOHEXOL 300 MG/ML  SOLN, OMNIPAQUE IOHEXOL 300 MG/ML  SOLN  Comparison: None.  Findings: Lung Bases: Dependent atelectasis.  Liver:  Focal fatty infiltration adjacent to the falciform ligament.  Tiny low density lesion in the inferior right hepatic lobe, probably representing a tiny cyst.  Scattered other low attenuation areas are present.  Spleen:  Normal.  Gallbladder:  Normal.  Common bile duct:  Normal.  Pancreas:  Normal.  Adrenal glands:  Normal.  Kidneys:  Normal enhancement.  Normal delayed excretion of contrast.  Both ureters appear within normal limits.  Stomach:  Patulous distal thoracic esophagus.  Tiny hiatal hernia. No inflammatory changes of the stomach.  Small bowel:  Duodenum appears normal.  There is  no small bowel obstruction proximally.  Small bowel is opacified with oral contrast to the level of the jejunum.  The ileum is fluid filled and demonstrates mucosal hyperenhancement, mild mesenteric fat stranding and borderline mural thickening.  The findings compatible with enteritis although this is nonspecific.  This can be associated with inflammatory bowel disease or enteric infection. Small amount of interloop free fluid is present.  Colon:   The appendix is identified in the right lower quadrant. Proximal appendix has a air centrally.  The tip of the appendix is poorly visualized and terminates adjacent to the right ovary and loops of small bowel in the right lower quadrant (image number [id number] series 2).  Acute appendicitis is considered unlikely.  There is however, difficult to completely exclude.  The ascending colon appears normal.  Transverse and descending colon normal.  Small amount of free  fluid is present in the anatomic pelvis around the rectosigmoid.  Pelvic Genitourinary:  Urinary bladder appears normal.  Uterus and adnexa grossly appear within normal limits.  Possible small anterior fundal fibroid.  Bones:  No aggressive osseous lesions.  Vasculature: Normal opacification.  No acute vascular abnormality.  IMPRESSION: 1.  Enteritis of the terminal ileum.  Findings suggest either enteric infection or inflammatory bowel disease/Crohn's disease. No colonic involvement. 2.  Appendix appears normal proximally.  The distal tip is difficult to visualize.  Acute appendicitis is considered unlikely based on the appearance.  Repeat scanning in 8 hours could offer further characterization of distal small bowel and better define the appendix once oral contrast has progressed further. 3.  Tiny low density hepatic lesions likely representing cysts. 4.  Small hiatal hernia and patulous distal thoracic esophagus.   Original Report Authenticated By: Andreas Newport, M.D.     Microbiology: Recent Results (from the  past 240 hour(s))  URINE CULTURE     Status: None   Collection Time    06/24/12  3:35 AM      Result Value Range Status   Specimen Description URINE, CLEAN CATCH   Final   Special Requests NONE   Final   Culture  Setup Time 06/24/2012 10:45   Final   Colony Count 40,000 COLONIES/ML   Final   Culture     Final   Value: Multiple bacterial morphotypes present, none predominant. Suggest appropriate recollection if clinically indicated.   Report Status 06/25/2012 FINAL   Final     Labs: Basic Metabolic Panel:  Recent Labs Lab 06/24/12 0057 06/24/12 0547 06/26/12 0500  NA 135 133* 138  K 2.8* 3.5 3.6  CL 97 96 104  CO2 28 26 24   GLUCOSE 159* 142* 115*  BUN 11 9 4*  CREATININE 0.81 0.75 0.81  CALCIUM 9.3 9.0 8.8   Liver Function Tests:  Recent Labs Lab 06/24/12 0057 06/24/12 0547 06/26/12 0500  AST 13 12 9   ALT 11 10 7   ALKPHOS 70 63 86  BILITOT 0.5 1.0 1.0  PROT 8.0 7.7 6.8  ALBUMIN 3.7 3.6 2.8*    Recent Labs Lab 06/24/12 0057  LIPASE 14    CBC:  Recent Labs Lab 06/24/12 0057 06/24/12 0547 06/26/12 0500  WBC 10.6* 11.5* 10.8*  NEUTROABS 7.5  --   --   HGB 13.0 13.0 10.8*  HCT 35.9* 37.1 30.3*  MCV 81.6 81.5 81.9  PLT 265 247 227        Signed:  GOSRANI,NIMISH C  [facility] 06/26/2012, 9:39 AM

## 2012-06-26 NOTE — Progress Notes (Signed)
[facility] SURGICAL UNIT [id number] [place] [address] Phone: [phone] Fax: [phone]  June 26, 2012  Patient: Christy Craig  Date of Birth: [birth year]  Date of Visit: 06/24/2012    To Whom It May Concern:  Christy Craig was seen and treated in our hospital on 06/24/2012 and discharged on 06/26/2012. DALISSA LOVIN  can return to work on 07/06/2012.  Sincerely,

## 2012-06-26 NOTE — Progress Notes (Signed)
Patient with orders to be discharge home. Discharge instructions given, patient verbalized understanding. Patient in stable condition upon discharge. Patient left in private vehicle with son.

## 2012-07-10 ENCOUNTER — Ambulatory Visit (INDEPENDENT_AMBULATORY_CARE_PROVIDER_SITE_OTHER): Payer: Self-pay | Admitting: Obstetrics and Gynecology

## 2012-07-10 ENCOUNTER — Encounter: Payer: Self-pay | Admitting: Obstetrics and Gynecology

## 2012-07-10 ENCOUNTER — Other Ambulatory Visit (HOSPITAL_COMMUNITY)
Admission: RE | Admit: 2012-07-10 | Discharge: 2012-07-10 | Disposition: A | Discharge status: Home / Self Care | Payer: BC Managed Care – PPO | Source: Ambulatory Visit | Attending: Obstetrics and Gynecology | Admitting: Obstetrics and Gynecology

## 2012-07-10 VITALS — BP 140/98 | Wt 191.8 lb

## 2012-07-10 DIAGNOSIS — R87620 Atypical squamous cells of undetermined significance on cytologic smear of vagina (ASC-US): Secondary | ICD-10-CM

## 2012-07-10 DIAGNOSIS — Z01419 Encounter for gynecological examination (general) (routine) without abnormal findings: Secondary | ICD-10-CM

## 2012-07-10 DIAGNOSIS — N898 Other specified noninflammatory disorders of vagina: Secondary | ICD-10-CM

## 2012-07-10 DIAGNOSIS — N852 Hypertrophy of uterus: Secondary | ICD-10-CM

## 2012-07-10 DIAGNOSIS — Z1151 Encounter for screening for human papillomavirus (HPV): Secondary | ICD-10-CM | POA: Insufficient documentation

## 2012-07-10 DIAGNOSIS — Z113 Encounter for screening for infections with a predominantly sexual mode of transmission: Secondary | ICD-10-CM | POA: Insufficient documentation

## 2012-07-10 DIAGNOSIS — N920 Excessive and frequent menstruation with regular cycle: Secondary | ICD-10-CM

## 2012-07-10 DIAGNOSIS — Z0000 Encounter for general adult medical examination without abnormal findings: Secondary | ICD-10-CM

## 2012-07-10 LAB — POCT WET PREP (WET MOUNT)
Source Wet Prep POC: NEGATIVE
Trichomonas Wet Prep HPF POC: NEGATIVE
WBC, Wet Prep HPF POC: NEGATIVE

## 2012-07-10 NOTE — Progress Notes (Signed)
  Assessment:  uterine enlargement, menorrhagia  hx Hsv-II infxn Plan:  pap smear with gc/cl counseled on breast self exam, mammography screening and menopause return annually or prn  Subjective:  Christy Craig is a 47 y.o. female, No obstetric history on file., who presents for folloup vag d/c and ut enlargement.noted while in hosp for terminal ilietis.  The following portions of the patient's history were reviewed and updated as appropriate: allergies, current medications, past medical & surgical history, & past family history.   There is no significant family history of breast or ovarian cancer.    Review of Systems Pertinent items are noted in HPI. Breast:Negative for breast lump,nipple discharge or nipple retraction Gastrointestinal: Negative for abdominal pain, change in bowel habits or rectal bleeding GU: Negative for dysuria, frequency, urgency or incontinence.   GYN: Patient's last menstrual period was 06/27/2012. menses last 7-8 days, clots x 2-3d. No anemial  Objective:  BP 140/98  Wt 191 lb 12.8 oz (87 kg)  BMI 31.92 kg/m2  LMP 06/27/2012    BMI: Body mass index is 31.92 kg/(m^2).  General Appearance: Alert, appropriate appearance for age. No acute distress HEENT: Grossly normal Neck / Thyroid: Supple, no masses, nodes or enlargement Cardiovascular: Regular rate and rhythm. S1, S2, no murmur Lungs: Clear to auscultation bilaterally Back: No CVA tenderness Gastrointestinal: Soft, non-tender, no masses or organomegaly Pelvic Exam: Vulva and vagina appear normal. Bimanual exam reveals normal uterus and adnexa. Adnexa: normal bimanual exam Uterus: anteverted, enlarged and symmetric enlargement above good pelvic support..10wk size uterus, but higher than that in abdomen Rectovaginal:   Christin Bach MD

## 2012-07-10 NOTE — Patient Instructions (Signed)
Check results on MYCHart in 1 week

## 2012-07-13 ENCOUNTER — Telehealth: Payer: Self-pay | Admitting: Obstetrics and Gynecology

## 2012-07-13 NOTE — Telephone Encounter (Signed)
Spoke with pt. Had a CT at [facility] which showed a mass on her uterus. She saw you on 07/10/12. Pt states she had a pap smear but no follow up appt was scheduled. What do you advise? Pt was a little confused.

## 2012-07-14 ENCOUNTER — Telehealth: Payer: Self-pay | Admitting: Obstetrics and Gynecology

## 2012-07-14 NOTE — Telephone Encounter (Signed)
Routed to Dr. Ferguson. JSY 

## 2012-07-14 NOTE — Telephone Encounter (Signed)
Left message for patient to call for followup appt with pelvic ultrasound to be done, and discussion of results at time of ultrasound.

## 2012-07-15 ENCOUNTER — Telehealth: Payer: Self-pay | Admitting: *Deleted

## 2012-07-15 NOTE — Telephone Encounter (Signed)
Routed to Dr. Ferguson. JSY 

## 2012-07-16 NOTE — Telephone Encounter (Signed)
Already discussed  c pt 2 days ago, advised appt for further questions.  Pt may schedule appt at her convenience. No return call needed at this time.

## 2012-07-16 NOTE — Telephone Encounter (Signed)
Spoke with pt. Pt aware that Dr. Emelda Fear wants pt to schedule appt for further questions. Pt didn't schedule appt at this time.

## 2012-07-27 ENCOUNTER — Encounter (INDEPENDENT_AMBULATORY_CARE_PROVIDER_SITE_OTHER): Payer: Self-pay | Admitting: Internal Medicine

## 2012-07-27 ENCOUNTER — Ambulatory Visit (INDEPENDENT_AMBULATORY_CARE_PROVIDER_SITE_OTHER): Payer: BC Managed Care – PPO | Admitting: Internal Medicine

## 2012-07-27 VITALS — BP 132/90 | HR 68 | Ht 66.0 in | Wt 190.8 lb

## 2012-07-27 DIAGNOSIS — K5289 Other specified noninfective gastroenteritis and colitis: Secondary | ICD-10-CM

## 2012-07-27 DIAGNOSIS — K529 Noninfective gastroenteritis and colitis, unspecified: Secondary | ICD-10-CM | POA: Insufficient documentation

## 2012-07-27 NOTE — Progress Notes (Signed)
Subjective:     Patient ID: Christy Craig, female   DOB: [birth year], 47 y.o.   MRN: [id number]  [facility] Underwent a CT which revealed enteritis of the terminal ileum. She was empirically treated with Cipro and Flagyl.  Here today for follow. She tells me she has lost weight 15 pounds since being in the hospital..\  She does have some epigastric pain sometimes. Some soreness suprapubic area. She has a BM daily. Small in size. Stools are formed. Appetite is better. She has an appt at [facility] Wednesday.   06/24/2012 CT abdomen/pelvis: 1. Enteritis of the terminal ileum. Findings suggest either  enteric infection or inflammatory bowel disease/Crohn's disease.  No colonic involvement.  2. Appendix appears normal proximally. The distal tip is  difficult to visualize. Acute appendicitis is considered unlikely  based on the appearance. Repeat scanning in 8 hours could offer  further characterization of distal small bowel and better define  the appendix once oral contrast has progressed further.  3. Tiny low density hepatic lesions likely representing cysts.  4. Small hiatal hernia and patulous distal thoracic esophagus.  Review of Systems see hpi Current Outpatient Prescriptions  Medication Sig Dispense Refill  . famotidine (PEPCID) 20 MG tablet Take 20 mg by mouth daily as needed for heartburn.      . hydrochlorothiazide (HYDRODIURIL) 25 MG tablet Take 25 mg by mouth daily.      . metoprolol (LOPRESSOR) 50 MG tablet Take 50 mg by mouth 2 (two) times daily.      . valACYclovir (VALTREX) 500 MG tablet Take 500 mg by mouth as needed.       No current facility-administered medications for this visit.        Objective:   Physical Exam  Filed Vitals:   07/27/12 1556  BP: 132/90  Pulse: 68  Height: 5\' 6"  (1.676 m)  Weight: 190 lb 12.8 oz (86.546 kg)   Alert and oriented. Skin warm and dry. Oral mucosa is moist.   . Sclera anicteric,  conjunctivae is pink. Thyroid not enlarged. No cervical lymphadenopathy. Lungs clear. Heart regular rate and rhythm.  Abdomen is soft. Bowel sounds are positive. No hepatomegaly. No abdominal masses felt. No tenderness.  No edema to lower extremities.       Assessment:    Possible Enteric   infection vis Crohn's disease. Plan:    Will repeat a CT abdomen/pelvis with CM.

## 2012-07-27 NOTE — Patient Instructions (Addendum)
CT abdomen/pelvis with CM.  

## 2012-07-29 ENCOUNTER — Ambulatory Visit (INDEPENDENT_AMBULATORY_CARE_PROVIDER_SITE_OTHER): Payer: BC Managed Care – PPO | Admitting: Adult Health

## 2012-07-29 ENCOUNTER — Encounter: Payer: Self-pay | Admitting: Adult Health

## 2012-07-29 VITALS — BP 142/82 | Ht 65.0 in | Wt 190.4 lb

## 2012-07-29 DIAGNOSIS — D219 Benign neoplasm of connective and other soft tissue, unspecified: Secondary | ICD-10-CM | POA: Insufficient documentation

## 2012-07-29 DIAGNOSIS — N920 Excessive and frequent menstruation with regular cycle: Secondary | ICD-10-CM

## 2012-07-29 DIAGNOSIS — K449 Diaphragmatic hernia without obstruction or gangrene: Secondary | ICD-10-CM | POA: Insufficient documentation

## 2012-07-29 DIAGNOSIS — D259 Leiomyoma of uterus, unspecified: Secondary | ICD-10-CM

## 2012-07-29 NOTE — Progress Notes (Signed)
Subjective:     Patient ID: Christy Craig, female   DOB: 03-28-1966, 47 y.o.   MRN: [id number]  HPI Christy Craig is a 47 year old black female in today in follow up of a CT.She was seen by Dr. Emelda Fear last week and had a pap which was negative.And she has seen Dorene Ar NP at Dr, Patty Sermons office in follow up of hospitalization for ?ileus. She has heavy periods at times and has a uterine fibroid.  Review of Systems Patient denies any headaches, blurred vision, shortness of breath, chest pain, abdominal pain, problems with bowel movements, urination, or intercourse at present. No joint swelling or mood changes. Reviewed past medical,surgical, social and family history. Reviewed medications and allergies.     Objective:   Physical Exam Blood pressure 142/82, height 5\' 5"  (1.651 m), weight 190 lb 6.4 oz (86.365 kg), last menstrual period 07/28/2012. Talk only. I reviewed her CT with her, her pap smear and visit with Dr. Emelda Fear and we discussed the herpes virus.   She has a follow up CT scheduled for 5/6/1/4 Assessment:      Menorrhagia Uterine fibroids History of herpes Hiatal hernia Recent hospitalization for ?ileus and enteritis    Plan:      Review handouts on fibroids,endo ablation and herpes   Follow up prn

## 2012-07-29 NOTE — Patient Instructions (Addendum)
Review handouts on herpes,endo ablation and uterine fibroids Call prn problems

## 2012-08-04 ENCOUNTER — Ambulatory Visit (HOSPITAL_COMMUNITY)
Admission: RE | Admit: 2012-08-04 | Discharge: 2012-08-04 | Disposition: A | Discharge status: Home / Self Care | Payer: BC Managed Care – PPO | Source: Ambulatory Visit | Attending: Internal Medicine | Admitting: Internal Medicine

## 2012-08-04 ENCOUNTER — Telehealth (INDEPENDENT_AMBULATORY_CARE_PROVIDER_SITE_OTHER): Payer: Self-pay | Admitting: Internal Medicine

## 2012-08-04 DIAGNOSIS — N949 Unspecified condition associated with female genital organs and menstrual cycle: Secondary | ICD-10-CM | POA: Insufficient documentation

## 2012-08-04 DIAGNOSIS — R109 Unspecified abdominal pain: Secondary | ICD-10-CM | POA: Insufficient documentation

## 2012-08-04 DIAGNOSIS — K529 Noninfective gastroenteritis and colitis, unspecified: Secondary | ICD-10-CM

## 2012-08-04 MED ORDER — IOHEXOL 300 MG/ML  SOLN
100.0000 mL | Freq: Once | INTRAMUSCULAR | Status: AC | PRN
  Administered 2012-08-04: 100 mL via INTRAVENOUS

## 2012-08-04 NOTE — Telephone Encounter (Signed)
Results given to daughter.

## 2014-01-31 ENCOUNTER — Encounter: Payer: Self-pay | Admitting: Adult Health

## 2015-10-20 ENCOUNTER — Other Ambulatory Visit (HOSPITAL_COMMUNITY): Payer: Self-pay | Admitting: Physician Assistant

## 2015-10-20 DIAGNOSIS — R079 Chest pain, unspecified: Secondary | ICD-10-CM

## 2016-03-28 ENCOUNTER — Encounter (HOSPITAL_COMMUNITY): Payer: Self-pay | Admitting: Emergency Medicine

## 2016-03-28 ENCOUNTER — Emergency Department (HOSPITAL_COMMUNITY): Payer: 59

## 2016-03-28 ENCOUNTER — Emergency Department (HOSPITAL_COMMUNITY)
Admission: EM | Admit: 2016-03-28 | Discharge: 2016-03-29 | Disposition: A | Discharge status: Home / Self Care | Payer: 59 | Attending: Emergency Medicine | Admitting: Emergency Medicine

## 2016-03-28 DIAGNOSIS — Z79899 Other long term (current) drug therapy: Secondary | ICD-10-CM | POA: Diagnosis not present

## 2016-03-28 DIAGNOSIS — N1330 Unspecified hydronephrosis: Secondary | ICD-10-CM | POA: Diagnosis not present

## 2016-03-28 DIAGNOSIS — I10 Essential (primary) hypertension: Secondary | ICD-10-CM | POA: Insufficient documentation

## 2016-03-28 DIAGNOSIS — R1032 Left lower quadrant pain: Secondary | ICD-10-CM | POA: Diagnosis present

## 2016-03-28 DIAGNOSIS — R109 Unspecified abdominal pain: Secondary | ICD-10-CM

## 2016-03-28 LAB — URINALYSIS, ROUTINE W REFLEX MICROSCOPIC
BILIRUBIN URINE: NEGATIVE
GLUCOSE, UA: NEGATIVE mg/dL
HGB URINE DIPSTICK: NEGATIVE
KETONES UR: NEGATIVE mg/dL
Leukocytes, UA: NEGATIVE
Nitrite: NEGATIVE
PH: 6 (ref 5.0–8.0)
Protein, ur: NEGATIVE mg/dL
Specific Gravity, Urine: 1.02 (ref 1.005–1.030)

## 2016-03-28 LAB — CBC
HEMATOCRIT: 35.2 % — AB (ref 36.0–46.0)
Hemoglobin: 12.4 g/dL (ref 12.0–15.0)
MCH: 29.3 pg (ref 26.0–34.0)
MCHC: 35.2 g/dL (ref 30.0–36.0)
MCV: 83.2 fL (ref 78.0–100.0)
PLATELETS: 257 10*3/uL (ref 150–400)
RBC: 4.23 MIL/uL (ref 3.87–5.11)
RDW: 14 % (ref 11.5–15.5)
WBC: 6.2 10*3/uL (ref 4.0–10.5)

## 2016-03-28 LAB — COMPREHENSIVE METABOLIC PANEL
ALBUMIN: 3.9 g/dL (ref 3.5–5.0)
ALT: 16 U/L (ref 14–54)
AST: 21 U/L (ref 15–41)
Alkaline Phosphatase: 58 U/L (ref 38–126)
Anion gap: 6 (ref 5–15)
BILIRUBIN TOTAL: 1 mg/dL (ref 0.3–1.2)
BUN: 11 mg/dL (ref 6–20)
CHLORIDE: 102 mmol/L (ref 101–111)
CO2: 29 mmol/L (ref 22–32)
CREATININE: 0.78 mg/dL (ref 0.44–1.00)
Calcium: 8.7 mg/dL — ABNORMAL LOW (ref 8.9–10.3)
GFR calc Af Amer: >60 mL/min (ref >60)
GFR calc non Af Amer: >60 mL/min (ref >60)
GLUCOSE: 103 mg/dL — AB (ref 65–99)
POTASSIUM: 3.2 mmol/L — AB (ref 3.5–5.1)
Sodium: 137 mmol/L (ref 135–145)
Total Protein: 7.7 g/dL (ref 6.5–8.1)

## 2016-03-28 LAB — LIPASE, BLOOD: LIPASE: 22 U/L (ref 11–51)

## 2016-03-28 MED ORDER — HYDROMORPHONE HCL 1 MG/ML IJ SOLN
1.0000 mg | Freq: Once | INTRAMUSCULAR | Status: AC
  Administered 2016-03-28: 1 mg via INTRAVENOUS
  Filled 2016-03-28: qty 1

## 2016-03-28 MED ORDER — ONDANSETRON HCL 4 MG/2ML IJ SOLN
4.0000 mg | Freq: Once | INTRAMUSCULAR | Status: AC
  Administered 2016-03-28: 4 mg via INTRAVENOUS
  Filled 2016-03-28: qty 2

## 2016-03-28 MED ORDER — SODIUM CHLORIDE 0.9 % IV BOLUS (SEPSIS)
1000.0000 mL | Freq: Once | INTRAVENOUS | Status: AC
  Administered 2016-03-28: 1000 mL via INTRAVENOUS

## 2016-03-28 NOTE — ED Triage Notes (Signed)
Pt C/O lower abdominal pain with bloating and nausea that started 3 days ago. Pt denies diarrhea and vomiting.

## 2016-03-28 NOTE — ED Notes (Signed)
Patient states that she will get a urine specimen and clean really well due to being on her period at this time

## 2016-03-28 NOTE — ED Provider Notes (Signed)
[facility] DEPT Provider Note   CSN: [id number] Arrival date & time: 03/28/16  1911  By signing my name below, I, Higinio Plan, attest that this documentation has been prepared under the direction and in the presence of Sherwood Gambler, MD . Electronically Signed: Higinio Plan, Scribe. 03/28/2016. 11:49 PM.  History   Chief Complaint Chief Complaint  Patient presents with  . Abdominal Pain   The history is provided by the patient. No language interpreter was used.   HPI Comments: Christy Craig is a 50 y.o. female with PMHx of HTN and uterine fibroids, who presents to the Emergency Department complaining of gradually improving, suprapubic and LLQ abdominal pain that began 3 days ago. Pt reports associated nausea, decreased appetite due to pain, and "pressure" when urinating that began 2 days ago. She states she took pain medication this afternoon which improved her symptoms. She notes hx of similar symptoms in which she visited the ED on 06/24/12 and was admitted to the hospital to rule out Chron's disease. She denies fever, vomiting, blood in her stool, difficulty urinating, vaginal discharge, and PSHx to her abdomen. She states she is currently on her menses.   Past Medical History:  Diagnosis Date  . Hepatic cyst   . Hiatal hernia   . HSV (herpes simplex virus) infection   . Hypertension   . Uterine fibroid     Patient Active Problem List   Diagnosis Date Noted  . Fibroids 07/29/2012  . Hiatal hernia 07/29/2012  . Enteritis 07/27/2012  . Menorrhagia 07/10/2012  . Ileitis, terminal ([place]) 06/24/2012  . Abdominal pain, other specified site 06/24/2012  . Hypokalemia 06/24/2012  . Essential hypertension, benign 06/24/2012  . Obesity, unspecified 06/24/2012  . UTI (urinary tract infection) 06/24/2012  . Hyperglycemia 06/24/2012    Past Surgical History:  Procedure Laterality Date  . CESAREAN SECTION    . CYST EXCISION  1988   removed from buttock  . CYST REMOVAL HAND  1991   . TUBAL LIGATION      OB History    Gravida Para Term Preterm AB Living   3 3       3    SAB TAB Ectopic Multiple Live Births           3     Home Medications    Prior to Admission medications   Medication Sig Start Date End Date Taking? Authorizing Provider  acetaminophen (PAIN RELIEVER) 500 MG tablet Take 500-1,000 mg by mouth daily as needed for mild pain or moderate pain.   Yes Historical Provider, MD  hydrochlorothiazide (HYDRODIURIL) 25 MG tablet Take 25 mg by mouth daily.   Yes Historical Provider, MD  metoprolol (LOPRESSOR) 50 MG tablet Take 50 mg by mouth 2 (two) times daily.   Yes Historical Provider, MD  valACYclovir (VALTREX) 500 MG tablet Take 500 mg by mouth daily as needed (for flares).  06/26/12  Yes Nimish Luther Parody, MD  HYDROcodone-acetaminophen (NORCO) 5-325 MG tablet Take 1-2 tablets by mouth every 6 (six) hours as needed for severe pain. 03/29/16   Sherwood Gambler, MD  promethazine (PHENERGAN) 25 MG tablet Take 1 tablet (25 mg total) by mouth every 6 (six) hours as needed for nausea or vomiting. 03/29/16   Sherwood Gambler, MD    Family History Family History  Problem Relation Age of Onset  . Hypertension Sister   . Kidney failure Father   . Migraines Sister   . Ulcers Sister     Social History Social History  Substance Use Topics  . Smoking status: Never Smoker  . Smokeless tobacco: Never Used  . Alcohol use No     Comment: social      Allergies   Patient has no known allergies.   Review of Systems Review of Systems  Constitutional: Positive for appetite change. Negative for fever.  Gastrointestinal: Positive for abdominal pain and nausea. Negative for blood in stool and vomiting.  Genitourinary: Negative for difficulty urinating and vaginal discharge.  All other systems reviewed and are negative.  Physical Exam Updated Vital Signs BP 188/99 (BP Location: Left Arm)   Pulse 82   Temp 99 F (37.2 C) (Oral)   Resp 18   Ht 5\' 6"  (1.676 m)    Wt 205 lb (93 kg)   SpO2 100%   BMI 33.09 kg/m   Physical Exam  Constitutional: She is oriented to person, place, and time. She appears well-developed and well-nourished.  HENT:  Head: Normocephalic and atraumatic.  Right Ear: External ear normal.  Left Ear: External ear normal.  Nose: Nose normal.  Eyes: Right eye exhibits no discharge. Left eye exhibits no discharge.  Cardiovascular: Normal rate, regular rhythm and normal heart sounds.   Pulmonary/Chest: Effort normal and breath sounds normal.  Abdominal: Soft. There is tenderness.  No CVA tenderness. Moderate suprapubic and LLQ tenderness.   Neurological: She is alert and oriented to person, place, and time.  Skin: Skin is warm and dry.  Nursing note and vitals reviewed.  ED Treatments / Results  Labs (all labs ordered are listed, but only abnormal results are displayed) Labs Reviewed  COMPREHENSIVE METABOLIC PANEL - Abnormal; Notable for the following:       Result Value   Potassium 3.2 (*)    Glucose, Bld 103 (*)    Calcium 8.7 (*)    All other components within normal limits  CBC - Abnormal; Notable for the following:    HCT 35.2 (*)    All other components within normal limits  LIPASE, BLOOD  URINALYSIS, ROUTINE W REFLEX MICROSCOPIC  PREGNANCY, URINE    EKG  EKG Interpretation None       Radiology Ct Abdomen Pelvis W Contrast  Result Date: 03/29/2016 CLINICAL DATA:  Lower abdominal pain.  Pain for 3 days. EXAM: CT ABDOMEN AND PELVIS WITH CONTRAST TECHNIQUE: Multidetector CT imaging of the abdomen and pelvis was performed using the standard protocol following bolus administration of intravenous contrast. CONTRAST:  160mL ISOVUE-300 IOPAMIDOL (ISOVUE-300) INJECTION 61% COMPARISON:  CT 08/04/2012 FINDINGS: Lower chest: Dependent changes likely atelectasis. No pleural fluid. Hepatobiliary: Tiny hypodensity in the inferior right lobe. No additional focal lesion. No gallstones, gallbladder wall thickening, or  biliary dilatation. Pancreas: No ductal dilatation or inflammation. Spleen: Normal in size without focal abnormality. Adrenals/Urinary Tract: Normal adrenal glands. Mild bilateral hydronephrosis and hydroureter. Ureters are dilated to the urinary bladder which is distended. There is no bladder wall thickening. No urolithiasis. Symmetric excretion on delayed phase imaging. No perinephric edema. Stomach/Bowel: Small hiatal hernia. Stomach is distended with ingested contrast. No bowel wall thickening, abnormal distention or inflammation. Normal appendix. Small to moderate stool burden. Vascular/Lymphatic: No significant vascular findings are present. No enlarged abdominal or pelvic lymph nodes. Reproductive: Uterus appears prominent size, elongated spanning 15 cm craniocaudal. Probable fundal fibroid. Ovaries grossly symmetric. No adnexal mass. Tampon in the vagina. Other: No free air, free fluid, or intra-abdominal fluid collection. Musculoskeletal: There are no acute or suspicious osseous abnormalities. IMPRESSION: 1. Mild bilateral hydroureteronephrosis secondary to bladder distention.  No bladder wall thickening. 2. Probable uterine fibroid. 3. Small hiatal hernia. Electronically Signed   By: Jeb Levering M.D.   On: 03/29/2016 02:12    Procedures Procedures (including critical care time)  Medications Ordered in ED Medications  sodium chloride 0.9 % bolus 1,000 mL (0 mLs Intravenous Stopped 03/29/16 0329)  ondansetron (ZOFRAN) injection 4 mg (4 mg Intravenous Given 03/28/16 2338)  HYDROmorphone (DILAUDID) injection 1 mg (1 mg Intravenous Given 03/28/16 2338)  potassium chloride SA (K-DUR,KLOR-CON) CR tablet 40 mEq (40 mEq Oral Given 03/29/16 0513)  ondansetron (ZOFRAN) injection 4 mg (4 mg Intravenous Given 03/29/16 0042)  iopamidol (ISOVUE-300) 61 % injection 100 mL (100 mLs Intravenous Contrast Given 03/29/16 0141)  promethazine (PHENERGAN) injection 25 mg (25 mg Intramuscular Given 03/29/16 0355)     DIAGNOSTIC STUDIES:  Oxygen Saturation is 100% on RA, normal by my interpretation.    COORDINATION OF CARE:  11:10 PM Discussed treatment plan with pt at bedside and pt agreed to plan.  Initial Impression / Assessment and Plan / ED Course  I have reviewed the triage vital signs and the nursing notes.  Pertinent labs & imaging results that were available during my care of the patient were reviewed by me and considered in my medical decision making (see chart for details).  Clinical Course as of Mar 29 646  Thu Mar 28, 2016  2311 Will get CT, IV dilaudid and zofran.  [SG]  Fri Mar 29, 2016  [id number] D/w Dr Karsten Ro, no foley, outpatient f/u given this is possibly reflux.  [SG]    Clinical Course User Index [SG] Sherwood Gambler, MD    CT without acute cause of pain. Found to have mild hydronephrosis, after discussion with urology this is probably reflux. Post-void residual with 111 cc. Not c/w retention. I doubt this is causing her pain. No UTI. Labs unremarkable besides mild hypokalemia. No signs of bowel inflammation/infection. Pain now controlled. Had vomiting after drinking contrast and CT but this is controlled after IM phenergan (patient requested phenergan and IM). D/c home with PCP and urology f/u and discussed return precautions.  I personally performed the services described in this documentation, which was scribed in my presence. The recorded information has been reviewed and is accurate.   Final Clinical Impressions(s) / ED Diagnoses   Final diagnoses:  Abdominal pain, unspecified abdominal location  Hydronephrosis, unspecified hydronephrosis type    New Prescriptions Discharge Medication List as of 03/29/2016  4:38 AM    START taking these medications   Details  HYDROcodone-acetaminophen (NORCO) 5-325 MG tablet Take 1-2 tablets by mouth every 6 (six) hours as needed for severe pain., Starting Fri 03/29/2016, Print    promethazine (PHENERGAN) 25 MG tablet Take 1  tablet (25 mg total) by mouth every 6 (six) hours as needed for nausea or vomiting., Starting Fri 03/29/2016, Print         Sherwood Gambler, MD 03/29/16 [phone]

## 2016-03-29 LAB — PREGNANCY, URINE: Preg Test, Ur: NEGATIVE

## 2016-03-29 MED ORDER — PROMETHAZINE HCL 25 MG/ML IJ SOLN
25.0000 mg | Freq: Once | INTRAMUSCULAR | Status: AC
  Administered 2016-03-29: 25 mg via INTRAMUSCULAR

## 2016-03-29 MED ORDER — HYDROCODONE-ACETAMINOPHEN 5-325 MG PO TABS: 1.0000 | ORAL_TABLET | Freq: Four times a day (QID) | ORAL | 0 refills | Status: DC | PRN

## 2016-03-29 MED ORDER — PROMETHAZINE HCL 25 MG PO TABS: 25.0000 mg | ORAL_TABLET | Freq: Four times a day (QID) | ORAL | 0 refills | Status: DC | PRN

## 2016-03-29 MED ORDER — POTASSIUM CHLORIDE CRYS ER 20 MEQ PO TBCR
40.0000 meq | EXTENDED_RELEASE_TABLET | Freq: Once | ORAL | Status: AC
  Administered 2016-03-29: 40 meq via ORAL
  Filled 2016-03-29: qty 2

## 2016-03-29 MED ORDER — IOPAMIDOL (ISOVUE-300) INJECTION 61%
100.0000 mL | Freq: Once | INTRAVENOUS | Status: AC | PRN
Start: 2016-03-29 — End: 2016-03-29
  Administered 2016-03-29: 100 mL via INTRAVENOUS

## 2016-03-29 MED ORDER — PROMETHAZINE HCL 25 MG/ML IJ SOLN
25.0000 mg | Freq: Once | INTRAMUSCULAR | Status: DC
  Filled 2016-03-29: qty 1

## 2016-03-29 MED ORDER — ONDANSETRON HCL 4 MG/2ML IJ SOLN
4.0000 mg | Freq: Once | INTRAMUSCULAR | Status: AC
  Administered 2016-03-29: 4 mg via INTRAVENOUS
  Filled 2016-03-29: qty 2

## 2016-03-29 NOTE — ED Notes (Signed)
Patient to CT.

## 2016-12-17 ENCOUNTER — Other Ambulatory Visit: Payer: Self-pay | Admitting: Family Medicine

## 2016-12-17 DIAGNOSIS — Z30431 Encounter for routine checking of intrauterine contraceptive device: Secondary | ICD-10-CM

## 2016-12-25 ENCOUNTER — Ambulatory Visit (HOSPITAL_COMMUNITY)
Admission: RE | Admit: 2016-12-25 | Discharge: 2016-12-25 | Disposition: A | Discharge status: Home / Self Care | Payer: 59 | Source: Ambulatory Visit | Attending: Family Medicine | Admitting: Family Medicine

## 2016-12-25 DIAGNOSIS — N83202 Unspecified ovarian cyst, left side: Secondary | ICD-10-CM | POA: Insufficient documentation

## 2016-12-25 DIAGNOSIS — Z30431 Encounter for routine checking of intrauterine contraceptive device: Secondary | ICD-10-CM | POA: Diagnosis present

## 2016-12-25 DIAGNOSIS — N83201 Unspecified ovarian cyst, right side: Secondary | ICD-10-CM | POA: Diagnosis not present

## 2017-04-01 HISTORY — PX: PARTIAL HYSTERECTOMY: SHX80

## 2017-11-19 DIAGNOSIS — I10 Essential (primary) hypertension: Secondary | ICD-10-CM | POA: Insufficient documentation

## 2017-12-30 ENCOUNTER — Ambulatory Visit
Admission: RE | Admit: 2017-12-30 | Discharge: 2017-12-30 | Disposition: A | Discharge status: Home / Self Care | Payer: 59 | Source: Ambulatory Visit | Attending: Family Medicine | Admitting: Family Medicine

## 2017-12-30 ENCOUNTER — Other Ambulatory Visit: Payer: Self-pay | Admitting: Family Medicine

## 2017-12-30 ENCOUNTER — Encounter: Payer: Self-pay | Admitting: Family Medicine

## 2017-12-30 ENCOUNTER — Ambulatory Visit: Payer: 59 | Admitting: Family Medicine

## 2017-12-30 ENCOUNTER — Encounter (INDEPENDENT_AMBULATORY_CARE_PROVIDER_SITE_OTHER): Payer: Self-pay

## 2017-12-30 VITALS — BP 130/70 | HR 80 | Temp 98.2°F | Resp 16 | Ht 66.0 in | Wt 217.0 lb

## 2017-12-30 DIAGNOSIS — K449 Diaphragmatic hernia without obstruction or gangrene: Secondary | ICD-10-CM | POA: Diagnosis not present

## 2017-12-30 DIAGNOSIS — Z114 Encounter for screening for human immunodeficiency virus [HIV]: Secondary | ICD-10-CM

## 2017-12-30 DIAGNOSIS — Z113 Encounter for screening for infections with a predominantly sexual mode of transmission: Secondary | ICD-10-CM

## 2017-12-30 DIAGNOSIS — R071 Chest pain on breathing: Secondary | ICD-10-CM | POA: Insufficient documentation

## 2017-12-30 DIAGNOSIS — R1084 Generalized abdominal pain: Secondary | ICD-10-CM

## 2017-12-30 DIAGNOSIS — I10 Essential (primary) hypertension: Secondary | ICD-10-CM | POA: Diagnosis not present

## 2017-12-30 DIAGNOSIS — A6004 Herpesviral vulvovaginitis: Secondary | ICD-10-CM

## 2017-12-30 MED ORDER — OMEPRAZOLE 20 MG PO CPDR
20.0000 mg | DELAYED_RELEASE_CAPSULE | Freq: Every day | ORAL | 0 refills | Status: DC
Start: 2017-12-30 — End: 2019-07-27

## 2017-12-30 MED ORDER — VALACYCLOVIR HCL 500 MG PO TABS: 500.0000 mg | ORAL_TABLET | Freq: Every day | ORAL | 0 refills | Status: DC | PRN

## 2017-12-30 MED ORDER — HYDROCHLOROTHIAZIDE 12.5 MG PO TABS: 12.5000 mg | ORAL_TABLET | Freq: Every day | ORAL | 0 refills | Status: DC

## 2017-12-30 NOTE — Patient Instructions (Signed)
Please call to schedule your abdominal ultrasound at [phone].  You must call to schedule, they will not call you.

## 2017-12-30 NOTE — Progress Notes (Signed)
Name: Christy Craig   MRN: [id number]    DOB: Sep 28, 1965   Date:12/30/2017       Progress Note  Subjective  Chief Complaint  Chief Complaint  Patient presents with  . Establish Care  . Shortness of Breath    hurts to breath  . Abdominal Pain    HPI Pt presents to establish care - used to be seen at [facility]   Generalized Abdominal Pain:  History hysterectomy (partial) - she had fibroids that had connected to her kidneys - she had this performed 05/21/2017.  She notes symptoms returned about a month later (March 2019) - feeling full very quickly, a pulling sensation just under the umbilicus.  She has chronic nausea - this has not changed. Does have history hiatal hernia.  Endorses a few episode of diarrhea, but nothing consistent; has also felt constipated - has taken miralax - this no longer helps her.  She also endorses some chest pain with deep inspiration - described as soreness - has history of PNA in the past.  She does have BM's 2-3 times a day, but still feels like she is constipated because these are not large BM's.  Discussed benefit/risk of CT scan vs Xray vs [place] - we will start with CXR and abdominal US. - She is having colonoscopy on February 03, 2018. - She was hospitalized for terminal ileitis in ?2013-2014 at [facility].   - She declines CBC or CMP today - states she just had labs done at [facility].  I do recommend H. Pylori - We will trial omeprazole and daily miralax.  - She has had sexual 2 partners in the last year - we will check STI's today.  HTN:  -does not take medications as prescribed - current regimen includes Lopressor 50mg  BID and HCTZ 25mg  daily. It has been about 2 weeks since she took her BP medication. - taking medications as instructed, no medication side effects noted, no TIAs, no chest pain on exertion, no dyspnea on exertion, noting swelling of ankles - DASH diet discussed - pt does not follow a low  sodium diet; salt added to cooking - The following  are contributing factors: stress, sedentary lifestyle, saturated fats in diet  Obesity: Body mass index is 35.02 kg/m. Weight Management History: Diet: Balanced Exercise: not active Co-Morbid Conditions: hypertension; 2 or more of these conditions combined with BMI >30 is considered morbid obesity; is this diagnosis appropriate and/or added to patient's problem list? No  Genital Herpes - She takes Valtrex as needed for outbreaks; no current outbreaks.   Patient Active Problem List   Diagnosis Date Noted  . Fibroids 07/29/2012  . Hiatal hernia 07/29/2012  . Enteritis 07/27/2012  . Menorrhagia 07/10/2012  . Ileitis, terminal ([place]) 06/24/2012  . Abdominal pain, other specified site 06/24/2012  . Hypokalemia 06/24/2012  . Essential hypertension, benign 06/24/2012  . Obesity, unspecified 06/24/2012  . UTI (urinary tract infection) 06/24/2012  . Hyperglycemia 06/24/2012    Past Surgical History:  Procedure Laterality Date  . CESAREAN SECTION    . CYST EXCISION  1988   removed from buttock  . CYST REMOVAL HAND  1991  . TUBAL LIGATION      Family History  Problem Relation Age of Onset  . Hypertension Sister   . Kidney failure Father   . Migraines Sister   . Ulcers Sister     Social History   Socioeconomic History  . Marital status:  Divorced    Spouse name: Not on file  . Number of children: 3  . Years of education: Not on file  . Highest education level: Not on file  Occupational History  . Not on file  Social Needs  . Financial resource strain: Not hard at all  . Food insecurity:    Worry: Never true    Inability: Never true  . Transportation needs:    Medical: No    Non-medical: No  Tobacco Use  . Smoking status: Never Smoker  . Smokeless tobacco: Never Used  Substance and Sexual Activity  . Alcohol use: No    Comment: social   . Drug use: No  . Sexual activity: Not Currently    Birth  control/protection: Surgical  Lifestyle  . Physical activity:    Days per week: 0 days    Minutes per session: 0 min  . Stress: Not at all  Relationships  . Social connections:    Talks on phone: Not on file    Gets together: Not on file    Attends religious service: Not on file    Active member of club or organization: Not on file    Attends meetings of clubs or organizations: Not on file    Relationship status: Not on file  . Intimate partner violence:    Fear of current or ex partner: Not on file    Emotionally abused: Not on file    Physically abused: Not on file    Forced sexual activity: Not on file  Other Topics Concern  . Not on file  Social History Narrative  . Not on file     Current Outpatient Medications:  .  acetaminophen (PAIN RELIEVER) 500 MG tablet, Take 500-1,000 mg by mouth daily as needed for mild pain or moderate pain., Disp: , Rfl:  .  hydrochlorothiazide (HYDRODIURIL) 25 MG tablet, Take 25 mg by mouth daily., Disp: , Rfl:  .  valACYclovir (VALTREX) 500 MG tablet, Take 500 mg by mouth daily as needed (for flares). , Disp: , Rfl:  .  HYDROcodone-acetaminophen (NORCO) 5-325 MG tablet, Take 1-2 tablets by mouth every 6 (six) hours as needed for severe pain. (Patient not taking: Reported on 12/30/2017), Disp: 10 tablet, Rfl: 0 .  metoprolol (LOPRESSOR) 50 MG tablet, Take 50 mg by mouth 2 (two) times daily., Disp: , Rfl:  .  promethazine (PHENERGAN) 25 MG tablet, Take 1 tablet (25 mg total) by mouth every 6 (six) hours as needed for nausea or vomiting. (Patient not taking: Reported on 12/30/2017), Disp: 10 tablet, Rfl: 0  No Known Allergies  I personally reviewed active problem list, medication list, allergies, family history, social history, health maintenance, notes from last encounter, lab results with the patient/caregiver today.   ROS  Ten systems reviewed and is negative except as mentioned in HPI.  Objective  Vitals:   12/30/17 1102  BP: 130/70    Pulse: 80  Resp: 16  Temp: 98.2 F (36.8 C)  TempSrc: Oral  SpO2: 98%  Weight: 217 lb (98.4 kg)  Height: 5\' 6"  (1.676 m)   Body mass index is 35.02 kg/m.  Physical Exam Constitutional: Patient appears well-developed and well-nourished. No distress.  HENT: Head: Normocephalic and atraumatic.  Nose: Nose normal. Mouth/Throat: Oropharynx is clear and moist. No oropharyngeal exudate or tonsillar swelling.  Eyes: Conjunctivae and EOM are normal. No scleral icterus.  Pupils are equal, round, and reactive to light.  Neck: Normal range of motion. Neck supple. No  JVD present. No thyromegaly present.  Cardiovascular: Normal rate, regular rhythm and normal heart sounds.  No murmur heard. No BLE edema. Pulmonary/Chest: Effort normal and breath sounds normal. No respiratory distress. Abdominal: Soft. Bowel sounds are normal, no distension. There is no point tenderness - there is generalized mild tenderness of the abdomen without guarding or HSM. No masses. Musculoskeletal: Normal range of motion, no joint effusions. No gross deformities Neurological: Pt is alert and oriented to person, place, and time. No cranial nerve deficit. Coordination, balance, strength, speech and gait are normal.  Skin: Skin is warm and dry. No rash noted. No erythema.  Psychiatric: Patient has a normal mood and affect. behavior is normal. Judgment and thought content normal.  No results found for this or any previous visit (from the past 72 hour(s)).  PHQ2/9: Depression screen PHQ 2/9 12/30/2017  Decreased Interest 0  Down, Depressed, Hopeless 0  PHQ - 2 Score 0  Altered sleeping 0  Tired, decreased energy 0  Change in appetite 0  Feeling bad or failure about yourself  0  Trouble concentrating 0  Moving slowly or fidgety/restless 0  Suicidal thoughts 0  PHQ-9 Score 0  Difficult doing work/chores Not difficult at all    Fall Risk: Fall Risk  12/30/2017  Falls in the past year? No   Assessment & Plan  1.  Hiatal hernia - US Abdomen Complete; Future - omeprazole (PRILOSEC) 20 MG capsule; Take 1 capsule (20 mg total) by mouth daily.  Dispense: 90 capsule; Refill: 0 - US Abdomen Complete  2. Generalized abdominal pain - US Abdomen Complete; Future - H. pylori breath test - CBC - COMPLETE METABOLIC PANEL WITH GFR - omeprazole (PRILOSEC) 20 MG capsule; Take 1 capsule (20 mg total) by mouth daily.  Dispense: 90 capsule; Refill: 0 - Miralax daily. - We will start with labs and imaging as above, treat for GERD and constipation as well to see if this helps her symptoms while we await results.  - Cervicovaginal ancillary only - US Abdomen Complete  3. Essential hypertension, benign - hydrochlorothiazide (HYDRODIURIL) 12.5 MG tablet; Take 1 tablet (12.5 mg total) by mouth daily.  Dispense: 90 tablet; Refill: 0 - COMPLETE METABOLIC PANEL WITH GFR - DASH diet discussed in detail  4. Inspiratory pain - DG Chest 2 View; Future - DG Chest 2 View  5. Routine screening for STI (sexually transmitted infection) - HIV Antibody (routine testing w rflx) - RPR - Cervicovaginal ancillary only  6. Encounter for screening for HIV - HIV Antibody (routine testing w rflx)  7. Herpes simplex vulvovaginitis - Valtrex PRN.  Face-to-face time with patient was more than 25 minutes, >50% time spent counseling and coordination of care

## 2018-01-01 LAB — COMPLETE METABOLIC PANEL WITH GFR
AG RATIO: 1.6 (calc) (ref 1.0–2.5)
ALBUMIN MSPROF: 4.1 g/dL (ref 3.6–5.1)
ALKALINE PHOSPHATASE (APISO): 57 U/L (ref 33–115)
ALT: 13 U/L (ref 6–29)
AST: 14 U/L (ref 10–35)
BILIRUBIN TOTAL: 1.1 mg/dL (ref 0.2–1.2)
BUN: 13 mg/dL (ref 7–25)
CHLORIDE: 103 mmol/L (ref 98–110)
CO2: 23 mmol/L (ref 20–32)
Calcium: 8.9 mg/dL (ref 8.6–10.2)
Creat: 0.81 mg/dL (ref 0.50–1.10)
GFR, EST AFRICAN AMERICAN: 99 mL/min/{1.73_m2} (ref >=60)
GFR, Est Non African American: 85 mL/min/{1.73_m2} (ref >=60)
GLUCOSE: 87 mg/dL (ref 65–139)
Globulin: 2.5 g/dL (calc) (ref 1.9–3.7)
POTASSIUM: 3.6 mmol/L (ref 3.5–5.3)
Sodium: 137 mmol/L (ref 135–146)
TOTAL PROTEIN: 6.6 g/dL (ref 6.1–8.1)

## 2018-01-01 LAB — CBC
HCT: 32.9 % — ABNORMAL LOW (ref 35.0–45.0)
Hemoglobin: 11.7 g/dL (ref 11.7–15.5)
MCH: 29.5 pg (ref 27.0–33.0)
MCHC: 35.6 g/dL (ref 32.0–36.0)
MCV: 83.1 fL (ref 80.0–100.0)
MPV: 11.6 fL (ref 7.5–12.5)
PLATELETS: 272 10*3/uL (ref 140–400)
RBC: 3.96 10*6/uL (ref 3.80–5.10)
RDW: 14 % (ref 11.0–15.0)
WBC: 6.9 10*3/uL (ref 3.8–10.8)

## 2018-01-01 LAB — HIV ANTIBODY (ROUTINE TESTING W REFLEX): HIV: NONREACTIVE

## 2018-01-01 LAB — H. PYLORI BREATH TEST: H. PYLORI BREATH TEST: NOT DETECTED

## 2018-01-01 LAB — RPR: RPR Ser Ql: NONREACTIVE

## 2018-01-01 LAB — SPECIMEN STATUS REPORT

## 2018-01-04 LAB — SPECIMEN STATUS REPORT

## 2018-01-04 LAB — CHLAMYDIA/GONOCOCCUS/TRICHOMONAS, NAA
Chlamydia by NAA: NEGATIVE
Gonococcus by NAA: NEGATIVE
TRICH VAG BY NAA: NEGATIVE

## 2018-01-07 ENCOUNTER — Ambulatory Visit
Admission: RE | Admit: 2018-01-07 | Discharge: 2018-01-07 | Disposition: A | Discharge status: Home / Self Care | Payer: 59 | Source: Ambulatory Visit | Attending: Family Medicine | Admitting: Family Medicine

## 2018-01-07 DIAGNOSIS — R1084 Generalized abdominal pain: Secondary | ICD-10-CM | POA: Diagnosis not present

## 2018-01-07 DIAGNOSIS — K449 Diaphragmatic hernia without obstruction or gangrene: Secondary | ICD-10-CM | POA: Diagnosis not present

## 2018-01-16 ENCOUNTER — Encounter: Payer: Self-pay | Admitting: Family Medicine

## 2018-01-19 MED ORDER — POLYETHYLENE GLYCOL 3350 17 GM/SCOOP PO POWD: 17.0000 g | Freq: Every day | ORAL | 0 refills | Status: DC

## 2018-06-02 ENCOUNTER — Ambulatory Visit: Payer: 59 | Admitting: Family Medicine

## 2018-06-03 ENCOUNTER — Other Ambulatory Visit: Payer: Self-pay

## 2018-06-03 ENCOUNTER — Ambulatory Visit: Payer: 59 | Admitting: Family Medicine

## 2018-06-03 ENCOUNTER — Encounter: Payer: Self-pay | Admitting: Family Medicine

## 2018-06-03 VITALS — BP 128/84 | HR 96 | Temp 98.0°F | Resp 16 | Ht 66.0 in | Wt 217.0 lb

## 2018-06-03 DIAGNOSIS — R197 Diarrhea, unspecified: Secondary | ICD-10-CM

## 2018-06-03 DIAGNOSIS — R1032 Left lower quadrant pain: Secondary | ICD-10-CM

## 2018-06-03 DIAGNOSIS — A6004 Herpesviral vulvovaginitis: Secondary | ICD-10-CM | POA: Diagnosis not present

## 2018-06-03 LAB — CBC WITH DIFFERENTIAL/PLATELET
Absolute Monocytes: 547 cells/uL (ref 200–950)
Basophils Absolute: 22 cells/uL (ref 0–200)
Basophils Relative: 0.3 %
Eosinophils Absolute: 43 cells/uL (ref 15–500)
Eosinophils Relative: 0.6 %
HCT: 34.3 % — ABNORMAL LOW (ref 35.0–45.0)
Hemoglobin: 12.1 g/dL (ref 11.7–15.5)
Lymphs Abs: 2290 cells/uL (ref 850–3900)
MCH: 29.7 pg (ref 27.0–33.0)
MCHC: 35.3 g/dL (ref 32.0–36.0)
MCV: 84.3 fL (ref 80.0–100.0)
MPV: 11 fL (ref 7.5–12.5)
Monocytes Relative: 7.6 %
Neutro Abs: 4298 cells/uL (ref 1500–7800)
Neutrophils Relative %: 59.7 %
Platelets: 286 10*3/uL (ref 140–400)
RBC: 4.07 10*6/uL (ref 3.80–5.10)
RDW: 14.5 % (ref 11.0–15.0)
Total Lymphocyte: 31.8 %
WBC: 7.2 10*3/uL (ref 3.8–10.8)

## 2018-06-03 LAB — COMPLETE METABOLIC PANEL WITH GFR
AG Ratio: 1.4 (calc) (ref 1.0–2.5)
ALT: 13 U/L (ref 6–29)
AST: 14 U/L (ref 10–35)
Albumin: 4 g/dL (ref 3.6–5.1)
Alkaline phosphatase (APISO): 69 U/L (ref 37–153)
BUN: 10 mg/dL (ref 7–25)
CO2: 30 mmol/L (ref 20–32)
Calcium: 8.5 mg/dL — ABNORMAL LOW (ref 8.6–10.4)
Chloride: 103 mmol/L (ref 98–110)
Creat: 0.69 mg/dL (ref 0.50–1.05)
GFR, Est African American: 118 mL/min/{1.73_m2} (ref >=60)
GFR, Est Non African American: 102 mL/min/{1.73_m2} (ref >=60)
Globulin: 2.8 g/dL (calc) (ref 1.9–3.7)
Glucose, Bld: 93 mg/dL (ref 65–99)
Potassium: 3.8 mmol/L (ref 3.5–5.3)
Sodium: 140 mmol/L (ref 135–146)
Total Bilirubin: 1.1 mg/dL (ref 0.2–1.2)
Total Protein: 6.8 g/dL (ref 6.1–8.1)

## 2018-06-03 MED ORDER — DICYCLOMINE HCL 20 MG PO TABS: 20.0000 mg | ORAL_TABLET | Freq: Three times a day (TID) | ORAL | 0 refills | Status: DC

## 2018-06-03 MED ORDER — VALACYCLOVIR HCL 500 MG PO TABS: 500.0000 mg | ORAL_TABLET | Freq: Every day | ORAL | 1 refills | Status: DC | PRN

## 2018-06-03 NOTE — Progress Notes (Signed)
Name: Christy Craig   MRN: [id number]    DOB: 06-01-1965   Date:06/03/2018       Progress Note  Subjective  Chief Complaint  Chief Complaint  Patient presents with  . Diarrhea    for 2 weeks  . Constipation  . Back Pain    started today with abdominal pain    HPI  Pt presents with concern for ongoing Diarrhea for 2 weeks.  Does have history of alternating diarrhea/constipation since she was in her 45's.  She notes today she did develop some abdominal and back pain pain with her diarrhea.  Back pain feels like a spasm - worse when she has to have a BM.  Abdominal pain is LLQ and is mostly relieved with BM, but not completely, does get abdominal cramping. Denies blood in stool, urinary symptoms, or vomiting.  Endorses some nausea. Has good appetite, drinking enough fluids. Colonoscopy done 02/03/2018 and was told to follow up in 5 years.   Genital Herpes: Has had increased breakouts lately, she has been under a lot more stress lately and has her son and son's girlfriend living with her. She needs refill of valtrex - we will do daily for now, then change to PRN once stress is reduced.  Patient Active Problem List   Diagnosis Date Noted  . 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 Fibroids 07/29/2012  . Hiatal hernia 07/29/2012  . Enteritis 07/27/2012  . Menorrhagia 07/10/2012  . 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 Abdominal pain 06/24/2012  . Hypokalemia 06/24/2012  . Essential hypertension, benign 06/24/2012  . Obesity, unspecified 06/24/2012  . UTI (urinary tract infection) 06/24/2012  . Hyperglycemia 06/24/2012    Social History   Tobacco Use  . Smoking status: Never Smoker  . Smokeless tobacco: Never Used  Substance Use Topics  . Alcohol use: No    Comment: social     Current Outpatient Medications:  .  acetaminophen (PAIN RELIEVER) 500 MG tablet, Take 500-1,000 mg by mouth daily as needed for mild pain or moderate pain., Disp: , Rfl:  .  hydrochlorothiazide  (HYDRODIURIL) 12.5 MG tablet, Take 1 tablet (12.5 mg total) by mouth daily., Disp: 90 tablet, Rfl: 0 .  omeprazole (PRILOSEC) 20 MG capsule, Take 1 capsule (20 mg total) by mouth daily., Disp: 90 capsule, Rfl: 0 .  polyethylene glycol powder (GLYCOLAX/MIRALAX) powder, Take 17 g by mouth daily., Disp: 255 g, Rfl: 0 .  valACYclovir (VALTREX) 500 MG tablet, Take 1 tablet (500 mg total) by mouth daily as needed (for flares)., Disp: 60 tablet, Rfl: 0  Allergies  Allergen Reactions  . Lisinopril Cough    I personally reviewed active problem list, medication list, allergies, lab results with the patient/caregiver today.  ROS  Ten systems reviewed and is negative except as mentioned in HPI  Objective  Vitals:   06/03/18 1001  BP: 128/84  Pulse: 96  Resp: 16  Temp: 98 F (36.7 C)  TempSrc: Oral  SpO2: 98%  Weight: 217 lb (98.4 kg)  Height: 5\' 6"  (1.676 m)    Body mass index is 35.02 kg/m.  Nursing Note and Vital Signs reviewed.  Physical Exam  Constitutional: Patient appears well-developed and well-nourished. No distress.  HENT: Head: Normocephalic and atraumatic.  Eyes: Conjunctivae and EOM are normal. No scleral icterus.  Neck: Normal range of motion. Neck supple.  Cardiovascular: Normal rate, regular rhythm and normal heart sounds.  No murmur heard. No BLE edema. Pulmonary/Chest: Effort normal and breath sounds normal.  No respiratory distress. Abdominal: Soft. Bowel sounds are normal, no distension. There is mild tenderness to BLQ without rebound or guarding. No masses. No CVA tenderness Musculoskeletal: Normal range of motion, no joint effusions. No gross deformities Neurological: Pt is alert and oriented to person, place, and time. No cranial nerve deficit. Coordination, balance, strength, speech and gait are normal.  Skin: Skin is warm and dry. No rash noted. No erythema.  Psychiatric: Patient has a normal mood and affect. behavior is normal. Judgment and thought content  normal.  No results found for this or any previous visit (from the past 72 hour(s)).  Assessment & Plan  1. Diarrhea, unspecified type - If not improving and labs are negative, we will refer to GI.  - Gastrointestinal Pathogen Panel PCR - CBC w/Diff/Platelet - COMPLETE METABOLIC PANEL WITH GFR  2. Herpes simplex vulvovaginitis - valACYclovir (VALTREX) 500 MG tablet; Take 1 tablet (500 mg total) by mouth daily as needed (for flares).  Dispense: 90 tablet; Refill: 1  3. Left lower quadrant abdominal pain - Labs per orders, referral to GI if negative evaluation.  -Red flags and when to present for emergency care or RTC including fever >101.45F, chest pain, shortness of breath, new/worsening/un-resolving symptoms, vomiting, worsening diarrhea, severe abdominal pain reviewed with patient at time of visit. Follow up and care instructions discussed and provided in AVS.

## 2018-06-11 DIAGNOSIS — J111 Influenza due to unidentified influenza virus with other respiratory manifestations: Secondary | ICD-10-CM | POA: Diagnosis not present

## 2018-06-11 DIAGNOSIS — R6889 Other general symptoms and signs: Secondary | ICD-10-CM | POA: Diagnosis not present

## 2018-06-16 DIAGNOSIS — J0140 Acute pansinusitis, unspecified: Secondary | ICD-10-CM | POA: Diagnosis not present

## 2018-06-19 DIAGNOSIS — N390 Urinary tract infection, site not specified: Secondary | ICD-10-CM | POA: Diagnosis not present

## 2018-06-24 DIAGNOSIS — R6889 Other general symptoms and signs: Secondary | ICD-10-CM | POA: Diagnosis not present

## 2018-06-30 DIAGNOSIS — R05 Cough: Secondary | ICD-10-CM | POA: Diagnosis not present

## 2018-08-04 ENCOUNTER — Encounter: Payer: Self-pay | Admitting: Family Medicine

## 2018-09-30 IMAGING — US US PELVIS COMPLETE TRANSABD/TRANSVAG
1 series · 13 of 25 positions shown · non-contrast
Comparison: CT 03/29/2016

CLINICAL DATA: Unable to visualize IUD string

EXAM:
TRANSABDOMINAL AND TRANSVAGINAL ULTRASOUND OF PELVIS
TECHNIQUE: Both transabdominal and transvaginal ultrasound examinations of the
pelvis were performed. Transabdominal technique was performed for
global imaging of the pelvis including uterus, ovaries, adnexal
regions, and pelvic cul-de-sac. It was necessary to proceed with
endovaginal exam following the transabdominal exam to visualize the
endometrium and ovaries.

[Series 1: us pelvis complete transabd/transvag · 0.24mm/px · 13 of 57 slices shown]
[im 1/57]
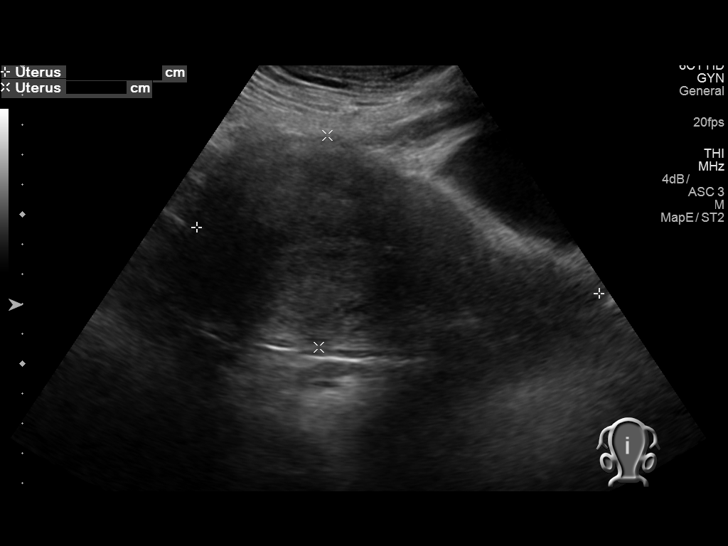
[im 5/57]
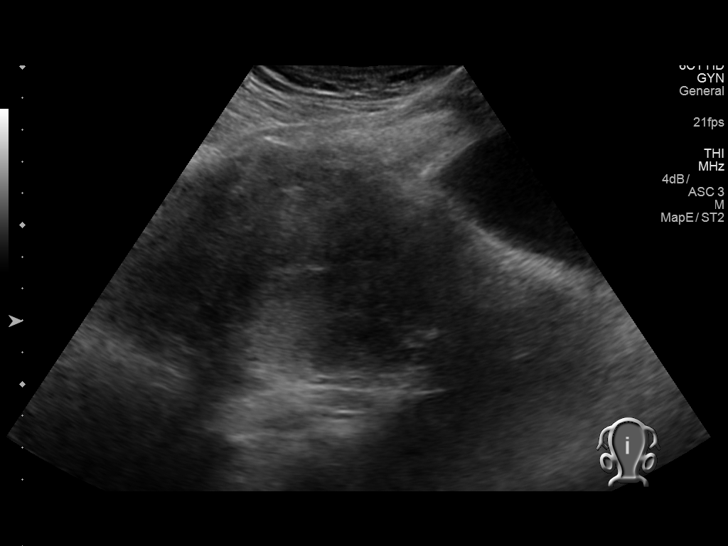
[im 10/57]
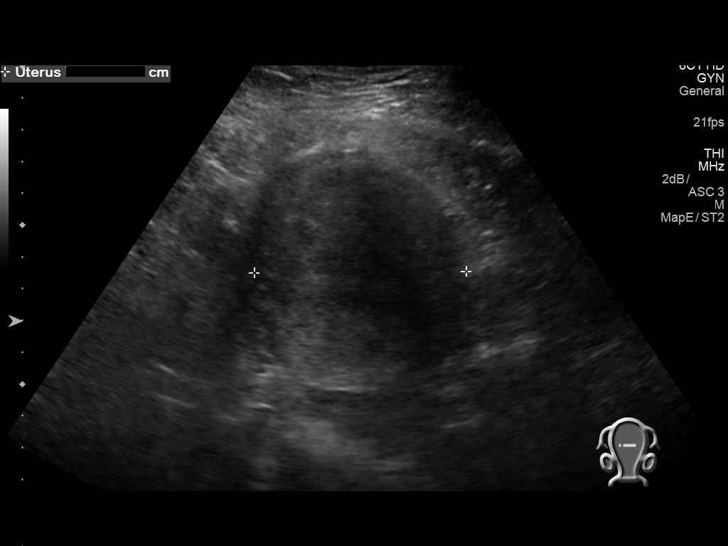
[im 15/57]
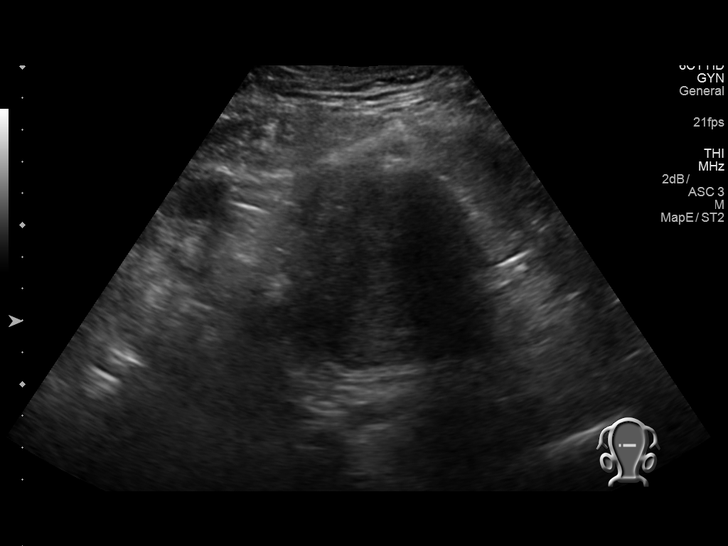
[im 19/57]
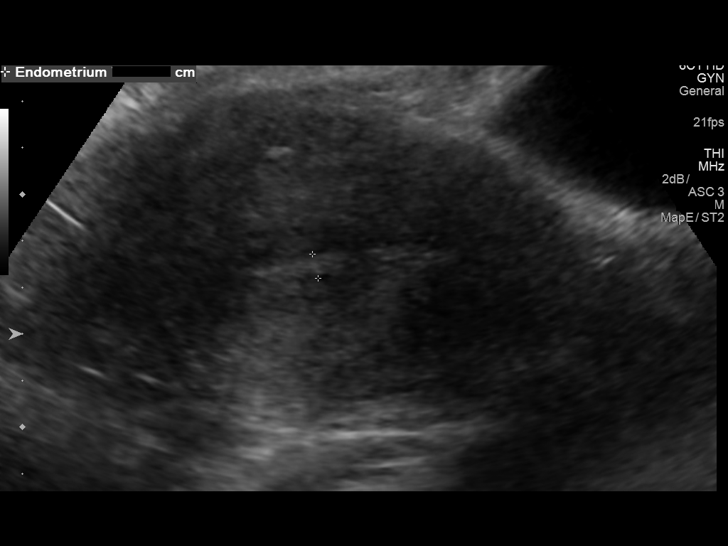
[im 24/57]
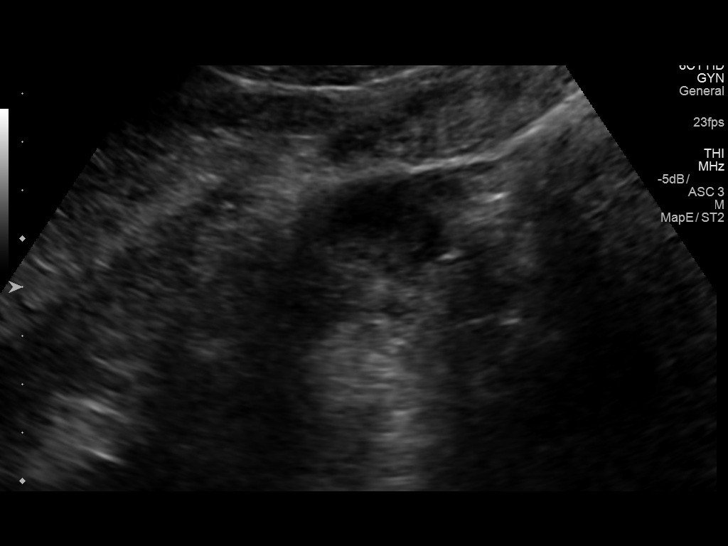
[im 29/57]
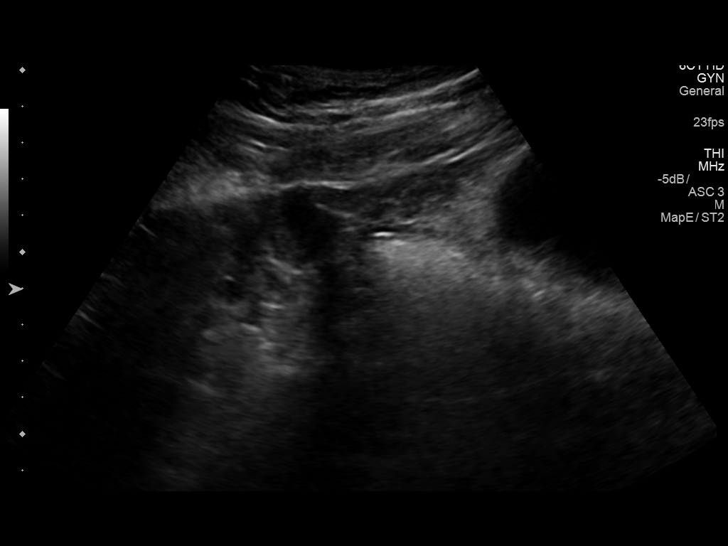
[im 33/57]
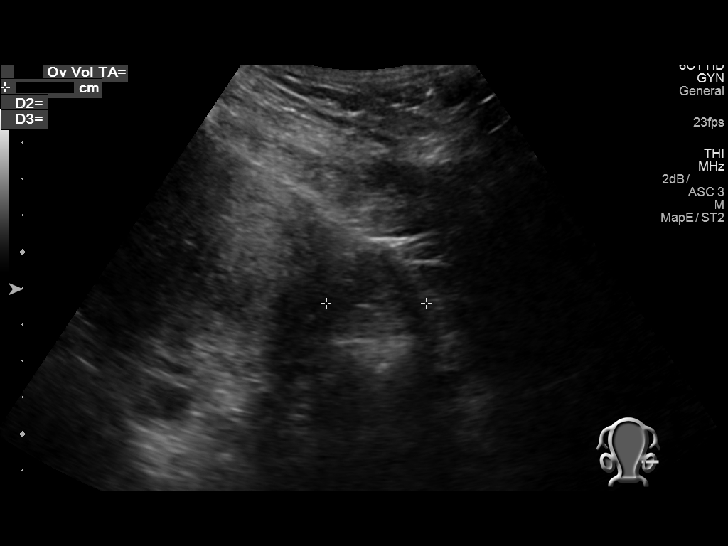
[im 38/57]
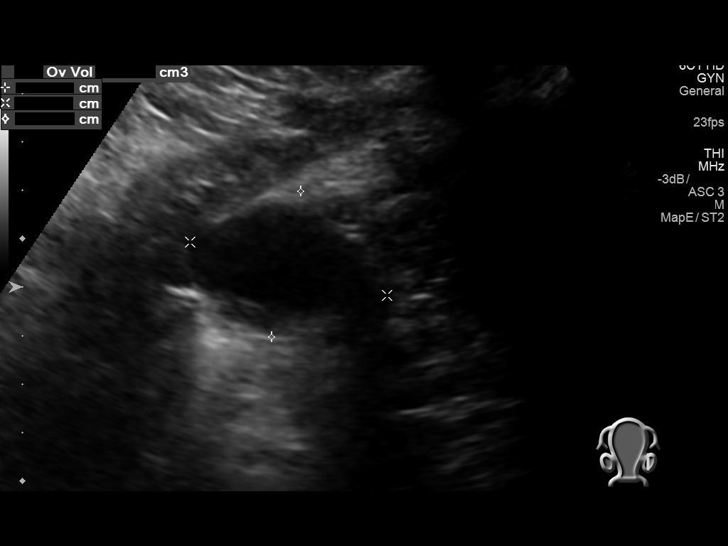
[im 43/57]
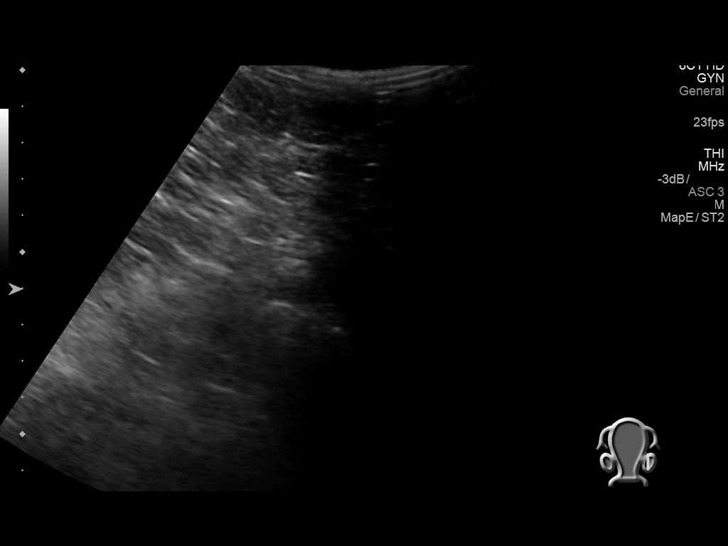
[im 47/57]
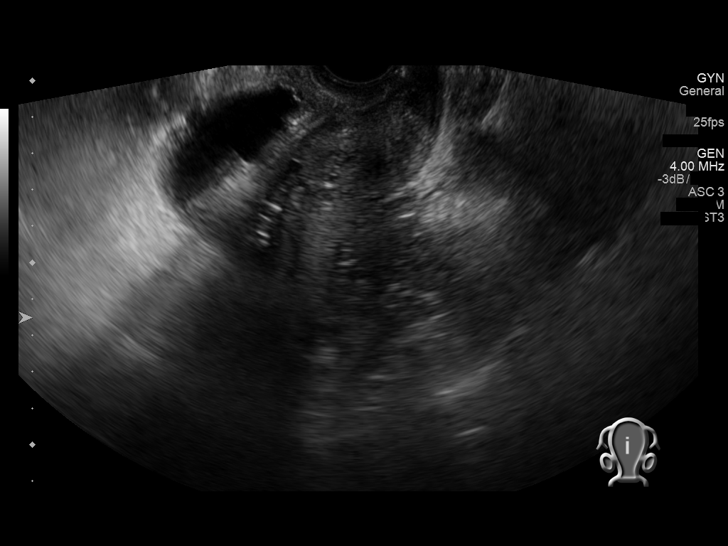
[im 52/57]
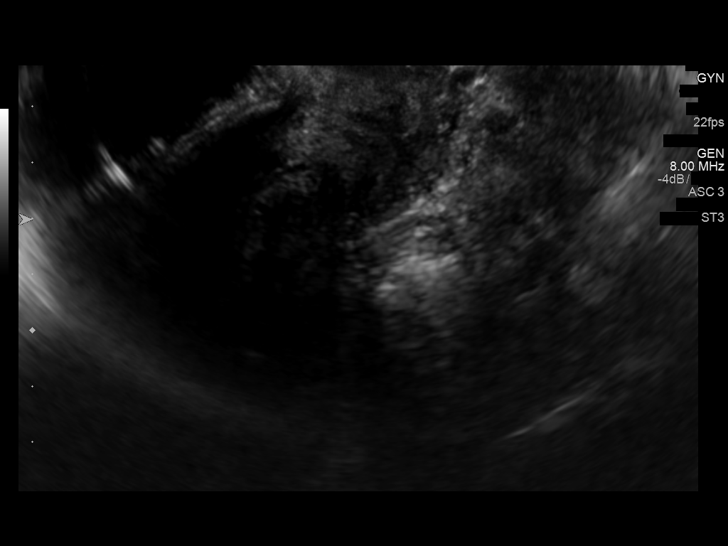
[im 57/57]
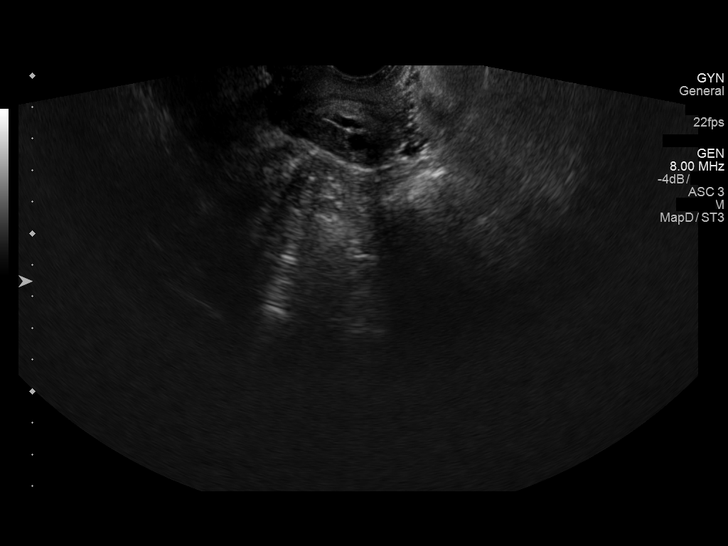

[13 of 25 positions shown; findings below may reference images not displayed]

FINDINGS: Uterus

Measurements: 13.6 x 7.1 x 6.7 cm. No fibroids or other mass
visualized.

Endometrium

Thickness: 5 mm. No focal abnormality visualized. Linear echodensity
within the lower uterine segment and cervix. Nabothian cysts in the
cervix.

Right ovary

Measurements: 3.3 x 2.8 x 2.4 cm trans abdominal. Cyst measuring
x 1.5 x 1.6 cm

Left ovary

Measurements: 2.8 by 4.2 x 3.1 cm trans abdominal.. Cyst measuring
2.9 x 2.1 x 2.3 cm

Other findings

No abnormal free fluid.
IMPRESSION: 1. Linear echodense focus visualized within the lower uterine
segment and cervix, may represent low-lying intrauterine device.
Could correlate with pelvic radiography.
2. Small cysts in the ovaries.

## 2018-12-08 DIAGNOSIS — R300 Dysuria: Secondary | ICD-10-CM | POA: Diagnosis not present

## 2018-12-08 DIAGNOSIS — N3001 Acute cystitis with hematuria: Secondary | ICD-10-CM | POA: Diagnosis not present

## 2018-12-28 DIAGNOSIS — M79671 Pain in right foot: Secondary | ICD-10-CM | POA: Diagnosis not present

## 2019-01-15 DIAGNOSIS — R6889 Other general symptoms and signs: Secondary | ICD-10-CM | POA: Diagnosis not present

## 2019-03-22 DIAGNOSIS — R300 Dysuria: Secondary | ICD-10-CM | POA: Diagnosis not present

## 2019-03-22 DIAGNOSIS — N390 Urinary tract infection, site not specified: Secondary | ICD-10-CM | POA: Diagnosis not present

## 2019-05-17 DIAGNOSIS — R6889 Other general symptoms and signs: Secondary | ICD-10-CM | POA: Diagnosis not present

## 2019-07-27 ENCOUNTER — Encounter: Payer: Self-pay | Admitting: Family Medicine

## 2019-07-27 ENCOUNTER — Ambulatory Visit (INDEPENDENT_AMBULATORY_CARE_PROVIDER_SITE_OTHER): Payer: BC Managed Care – PPO | Admitting: Family Medicine

## 2019-07-27 ENCOUNTER — Other Ambulatory Visit: Payer: Self-pay

## 2019-07-27 VITALS — BP 124/82 | HR 92 | Temp 97.6°F | Resp 14 | Ht 66.0 in | Wt 220.8 lb

## 2019-07-27 DIAGNOSIS — R1906 Epigastric swelling, mass or lump: Secondary | ICD-10-CM

## 2019-07-27 DIAGNOSIS — Z130 Encounter for screening for diseases of the blood and blood-forming organs and certain disorders involving the immune mechanism: Secondary | ICD-10-CM

## 2019-07-27 DIAGNOSIS — R635 Abnormal weight gain: Secondary | ICD-10-CM

## 2019-07-27 DIAGNOSIS — K5900 Constipation, unspecified: Secondary | ICD-10-CM

## 2019-07-27 DIAGNOSIS — Z13220 Encounter for screening for lipoid disorders: Secondary | ICD-10-CM | POA: Diagnosis not present

## 2019-07-27 DIAGNOSIS — K589 Irritable bowel syndrome without diarrhea: Secondary | ICD-10-CM

## 2019-07-27 DIAGNOSIS — Z0000 Encounter for general adult medical examination without abnormal findings: Secondary | ICD-10-CM

## 2019-07-27 DIAGNOSIS — Z1329 Encounter for screening for other suspected endocrine disorder: Secondary | ICD-10-CM

## 2019-07-27 DIAGNOSIS — E669 Obesity, unspecified: Secondary | ICD-10-CM

## 2019-07-27 DIAGNOSIS — Z1231 Encounter for screening mammogram for malignant neoplasm of breast: Secondary | ICD-10-CM

## 2019-07-27 DIAGNOSIS — Z13228 Encounter for screening for other metabolic disorders: Secondary | ICD-10-CM

## 2019-07-27 DIAGNOSIS — Z6835 Body mass index (BMI) 35.0-35.9, adult: Secondary | ICD-10-CM

## 2019-07-27 DIAGNOSIS — I10 Essential (primary) hypertension: Secondary | ICD-10-CM | POA: Diagnosis not present

## 2019-07-27 MED ORDER — HYDROCHLOROTHIAZIDE 12.5 MG PO TABS: 12.5000 mg | ORAL_TABLET | Freq: Every day | ORAL | 3 refills | Status: DC

## 2019-07-27 MED ORDER — PANTOPRAZOLE SODIUM 20 MG PO TBEC: 20.0000 mg | DELAYED_RELEASE_TABLET | Freq: Every day | ORAL | 2 refills | Status: DC

## 2019-07-27 NOTE — Progress Notes (Signed)
Patient: Christy Craig, Female    DOB: 08-23-1965, 54 y.o.   MRN: [id number] Hubbard Hartshorn, FNP Visit Date: 07/27/2019  Today's Provider: Delsa Grana, PA-C   Chief Complaint  Patient presents with  . Annual Exam   Subjective:   Annual physical exam:  Christy Craig is a 54 y.o. female who presents today for complete physical exam:  Exercise/Activity:  Trying to go walking  Diet/nutrition:  Water, lemon, doesn't eat big portions of food (feeling full with little food) Sleep:  Sleeps good, tired from working 2 jobs    Acute issues and new concerns: Upper GI/epigastric bloating/irritation, feels constipated - she is having BMs most days, but she feels like she never evacuates all the way.  She does feel full early even after taking a few bites of food she feels hungry but feels very full and bloated with epigastric burning and indigestion sometimes.  She does feel like she has soft formed stool feels like she has a bowel movement but then never fully gets out everything.  She previously had frequent diarrhea for most of her life she felt that it was normal to eat and then immediately have a loose watery bowel movement.  She did go to GI in the past and she has had a upper GI before and was on acid reflux medicine in the past that she is not currently taking anything.  She does feel that she has had increased abdominal obesity and bloating especially in the past couple years.   Concerned about weight.  She reports increasing weight over the past couple years despite eating very little she also does trying go walking.  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  In reviewing her weights from the past couple years her weight increase between 2017 and 2019 and has  been mostly steady since then with only a few pounds increase over the last year. She did have her hysterectomy in 2017 and had a unilateral oophorectomy.  Recently she has been having some hot flashes and sweats palpitations  Wt Readings from Last 5 Encounters:  07/27/19 220 lb 12.8 oz (100.2 kg)  06/03/18 217 lb (98.4 kg)  12/30/17 217 lb (98.4 kg)  03/28/16 205 lb (93 kg)  07/29/12 190 lb 6.4 oz (86.4 kg)   BMI Readings from Last 5 Encounters:  07/27/19 35.64 kg/m  06/03/18 35.02 kg/m  12/30/17 35.02 kg/m  03/28/16 33.09 kg/m  07/29/12 31.68 kg/m  She has not tried any medical weight management before and she has not done any dietitian or nutritional counseling. She is interested in returning to discuss this further  USPSTF grade A and B recommendations - reviewed and addressed today  Depression:  Phq 9 completed today by patient, was reviewed by me with patient in the room PHQ score is negative, pt feels that her mood is good PHQ 2/9 Scores 07/27/2019 06/03/2018 12/30/2017  PHQ - 2 Score 0 0 0  PHQ- 9 Score 0 0 0   Depression screen [facility] 2/9 07/27/2019 06/03/2018 12/30/2017  Decreased Interest 0 0 0  Down, Depressed, Hopeless 0 0 0  PHQ - 2 Score 0 0 0  Altered sleeping 0 0 0  Tired, decreased energy 0 0 0  Change in appetite 0 0 0  Feeling bad or failure about yourself  0 0 0  Trouble concentrating 0 0 0  Moving slowly or fidgety/restless 0 0 0  Suicidal thoughts 0 0 0  PHQ-9 Score 0 0 0  Difficult doing work/chores Not difficult at all Not difficult at all Not difficult at all    Alcohol screening:   Office Visit from 07/27/2019 in [facility]  AUDIT-C Score  0      Immunizations and Health Maintenance: Health Maintenance  Topic Date Due  . COVID-19 Vaccine (1) Never done  . PAP SMEAR-Modifier  07/11/2015  . MAMMOGRAM  Never done  . TETANUS/TDAP  07/26/2020 (Originally 03/09/1987)  . INFLUENZA VACCINE  10/31/2019  . COLONOSCOPY  02/14/2023    . HIV Screening  Completed     Hep C Screening: n/a  STD testing and prevention (HIV/chl/gon/syphilis):  see above, no additional testing desired by pt today HIV previously done STD screening ok today, new partner in the last 7 months   Intimate partner violence:   Safe   Sexual History/Pain during Intercourse: Divorced  Menstrual History/LMP/Abnormal Bleeding:  none Patient's last menstrual period was 03/28/2016.  Incontinence Symptoms: none  Breast cancer:   Due for mammogram ordered today- wants through [facility] Last Mammogram: *see HM list above BRCA gene screening: none done in the past  Cervical cancer screening: Due - last was 2014 Pt denies family hx of cancers - breast, ovarian, uterine, colon:    Uncle on moms side with cancers, no other  Osteoporosis:   Discussion on osteoporosis per age, including high calcium and vitamin D supplementation, weight bearing exercises Pt is not supplementing with daily calcium/Vit D. Roughly experienced menopause at age 65 y/o  Skin cancer:  Hx of skin CA -  NO Discussed atypical lesions   Colorectal cancer:   Colonoscopy is UTD 2024 Discussed concerning signs and sx of CRC, pt denies melena, hematochezia has had change in BM pattern   Lung cancer:   Low Dose CT Chest recommended if Age 74-80 years, 30 pack-year currently smoking OR have quit w/in 15years. Patient does not qualify.    Social History   Tobacco Use  . Smoking status: Never Smoker  . Smokeless tobacco: Never Used  Substance Use Topics  . Alcohol use: No    Comment: social   . Drug use: No       [facility] Visit from 07/27/2019 in [facility]  AUDIT-C Score  0      Family History  Problem Relation Age of Onset  . Hypertension Sister   . Kidney failure Father   . Migraines Sister   . Ulcers Sister      Blood pressure/Hypertension: BP Readings from Last 3 Encounters:  07/27/19 124/82  06/03/18 128/84  12/30/17 130/70     Weight/Obesity: Wt Readings from Last 3 Encounters:  07/27/19 220 lb 12.8 oz (100.2 kg)  06/03/18 217 lb (98.4 kg)  12/30/17 217 lb (98.4 kg)   BMI Readings from Last 3 Encounters:  07/27/19 35.64 kg/m  06/03/18 35.02 kg/m  12/30/17 35.02 kg/m     Lipids:  No results found for: CHOL No results found for: HDL No results found for: LDLCALC No results found for: TRIG No results found for: CHOLHDL No results found for: LDLDIRECT Based on the results of lipid panel his/her cardiovascular risk factor ( using Welsh )  in the next  10 years is: The ASCVD Risk score Mikey Bussing DC Jr., et al., 2013) failed to calculate for the following reasons:   Cannot find a previous HDL lab   Cannot find a previous total cholesterol lab Glucose:  Glucose, Bld  Date Value Ref Range Status  06/03/2018 93 65 - 99 mg/dL Final    Comment:    .            Fasting reference interval .   12/30/2017 87 65 - 139 mg/dL Final    Comment:    .        Non-fasting reference interval .   03/28/2016 103 (H) 65 - 99 mg/dL Final   Hypertension: BP Readings from Last 3 Encounters:  07/27/19 124/82  06/03/18 128/84  12/30/17 130/70   Obesity: Wt Readings from Last 3 Encounters:  07/27/19 220 lb 12.8 oz (100.2 kg)  06/03/18 217 lb (98.4 kg)  12/30/17 217 lb (98.4 kg)   BMI Readings from Last 3 Encounters:  07/27/19 35.64 kg/m  06/03/18 35.02 kg/m  12/30/17 35.02 kg/m      Advanced Care Planning:  A voluntary discussion about advance care planning including the explanation and discussion of advance directives.   Discussed health care proxy and Living will, and the patient was able to identify a health care proxy as Katherina Right her daughter. Patient does not have a living will at present time.   Social History      She        Social History   Socioeconomic History  . Marital status: Divorced    Spouse name: Not on file  . Number of children: 3  . Years of education: Not on file   . Highest education level: Not on file  Occupational History  . Not on file  Tobacco Use  . Smoking status: Never Smoker  . Smokeless tobacco: Never Used  Substance and Sexual Activity  . Alcohol use: No    Comment: social   . Drug use: No  . Sexual activity: Not Currently    Birth control/protection: Surgical  Other Topics Concern  . Not on file  Social History Narrative  . Not on file   Social Determinants of Health   Financial Resource Strain:   . Difficulty of Paying Living Expenses:   Food Insecurity:   . Worried About Charity fundraiser in the Last Year:   . Arboriculturist in the Last Year:   Transportation Needs:   . Film/video editor (Medical):   Marland Kitchen Lack of Transportation (Non-Medical):   Physical Activity:   . Days of Exercise per Week:   . Minutes of Exercise per Session:   Stress:   . Feeling of Stress :   Social Connections:   . Frequency of Communication with Friends and Family:   . Frequency of Social Gatherings with Friends and Family:   . Attends Religious Services:   . Active Member of Clubs or Organizations:   . Attends Archivist Meetings:   Marland Kitchen Marital Status:     Family History        Family History  Problem Relation Age of Onset  . Hypertension Sister   . Kidney failure Father   . Migraines Sister   . Ulcers Sister     Patient Active Problem List   Diagnosis Date Noted  . 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 Fibroids 07/29/2012  . Hiatal hernia 07/29/2012  . Enteritis 07/27/2012  . Menorrhagia 07/10/2012  .  Ileitis, terminal ([place]) 06/24/2012  . Abdominal pain 06/24/2012  . Hypokalemia 06/24/2012  . Essential hypertension, benign 06/24/2012  . Obesity, unspecified 06/24/2012  . UTI (urinary tract infection) 06/24/2012  . Hyperglycemia 06/24/2012    Past Surgical History:  Procedure Laterality Date  . ABDOMINAL HYSTERECTOMY    . CESAREAN SECTION    . CYST EXCISION  1988   removed from buttock  . CYST  REMOVAL HAND  1991  . PARTIAL HYSTERECTOMY  2019  . TUBAL LIGATION       Current Outpatient Medications:  .  valACYclovir (VALTREX) 500 MG tablet, Take 1 tablet (500 mg total) by mouth daily as needed (for flares)., Disp: 90 tablet, Rfl: 1  Allergies  Allergen Reactions  . Lisinopril Cough    Patient Care Team: Hubbard Hartshorn, FNP as PCP - General (Family Medicine)  Review of Systems  Constitutional: Negative.  Negative for activity change, appetite change, fatigue and unexpected weight change.  HENT: Negative.   Eyes: Negative.   Respiratory: Negative.  Negative for shortness of breath.   Cardiovascular: Negative.  Negative for chest pain, palpitations and leg swelling.  Gastrointestinal: Negative.  Negative for abdominal pain and blood in stool.  Endocrine: Negative.   Genitourinary: Negative.   Musculoskeletal: Negative.  Negative for arthralgias, gait problem, joint swelling and myalgias.  Skin: Negative.  Negative for color change, pallor and rash.  Allergic/Immunologic: Negative.   Neurological: Negative.  Negative for syncope and weakness.  Hematological: Negative.   Psychiatric/Behavioral: Negative.  Negative for confusion, dysphoric mood, self-injury and suicidal ideas. The patient is not nervous/anxious.      I personally reviewed active problem list, medication list, allergies, family history, social history, health maintenance, notes from last encounter, lab results, imaging with the patient/caregiver today.        Objective:   Vitals:  Vitals:   07/27/19 0901  BP: 124/82  Pulse: 92  Resp: 14  Temp: 97.6 F (36.4 C)  SpO2: 98%  Weight: 220 lb 12.8 oz (100.2 kg)  Height: 5' 6"  (1.676 m)    Body mass index is 35.64 kg/m.  Physical Exam Vitals and nursing note reviewed.  Constitutional:      General: She is not in acute distress.    Appearance: Normal appearance. She is well-developed. She is obese. She is not ill-appearing, toxic-appearing or  diaphoretic.  HENT:     Head: Normocephalic and atraumatic.     Right Ear: External ear normal.     Left Ear: External ear normal.     Nose: Nose normal.     Mouth/Throat:     Mouth: Mucous membranes are moist.     Pharynx: Oropharynx is clear. Uvula midline.  Eyes:     General: Lids are normal. No scleral icterus.    Conjunctiva/sclera: Conjunctivae normal.     Pupils: Pupils are equal, round, and reactive to light.  Neck:     Thyroid: Thyromegaly (mild) present.     Trachea: Phonation normal. No tracheal deviation.  Cardiovascular:     Rate and Rhythm: Normal rate and regular rhythm.     Pulses: Normal pulses.          Radial pulses are 2+ on the right side and 2+ on the left side.       Posterior tibial pulses are 2+ on the right side and 2+ on the left side.     Heart sounds: Normal heart sounds. No murmur. No friction rub. No gallop.   Pulmonary:  Effort: Pulmonary effort is normal. No respiratory distress.     Breath sounds: Normal breath sounds. No stridor. No wheezing, rhonchi or rales.  Chest:     Chest wall: No tenderness.     Breasts:        Right: Normal. No inverted nipple, mass, nipple discharge, skin change or tenderness.        Left: Normal. No inverted nipple, mass, nipple discharge, skin change or tenderness.  Abdominal:     General: Abdomen is protuberant. Bowel sounds are normal. There is no distension.     Palpations: Abdomen is soft.     Tenderness: There is no right CVA tenderness, left CVA tenderness, guarding or rebound.     Comments: Obese, soft, mild epigastric ttp  Musculoskeletal:        General: No deformity. Normal range of motion.     Cervical back: Normal range of motion and neck supple.  Lymphadenopathy:     Cervical: No cervical adenopathy.     Upper Body:     Right upper body: No supraclavicular, axillary or pectoral adenopathy.     Left upper body: No supraclavicular, axillary or pectoral adenopathy.  Skin:    General: Skin is warm  and dry.     Capillary Refill: Capillary refill takes less than 2 seconds.     Coloration: Skin is not pale.     Findings: No rash.  Neurological:     Mental Status: She is alert and oriented to person, place, and time.     Motor: No abnormal muscle tone.     Gait: Gait normal.  Psychiatric:        Mood and Affect: Mood normal.        Speech: Speech normal.        Behavior: Behavior normal.        Fall Risk: Fall Risk  07/27/2019 06/03/2018 12/30/2017  Falls in the past year? 0 0 No  Number falls in past yr: 0 0 -  Injury with Fall? 0 0 -  Follow up - Falls evaluation completed -    Functional Status Survey: Is the patient deaf or have difficulty hearing?: No Does the patient have difficulty seeing, even when wearing glasses/contacts?: No Does the patient have difficulty concentrating, remembering, or making decisions?: No Does the patient have difficulty walking or climbing stairs?: No Does the patient have difficulty dressing or bathing?: No Does the patient have difficulty doing errands alone such as visiting a doctor's office or shopping?: No   Assessment & Plan:    CPE completed today  . USPSTF grade A and B recommendations reviewed with patient; age-appropriate recommendations, preventive care, screening tests, etc discussed and encouraged; healthy living encouraged; see AVS for patient education given to patient  . Discussed importance of 150 minutes of physical activity weekly, AHA exercise recommendations given to pt in AVS/handout  . Discussed importance of healthy diet:  eating lean meats and proteins, avoiding trans fats and saturated fats, avoid simple sugars and excessive carbs in diet, eat 6 servings of fruit/vegetables daily and drink plenty of water and avoid sweet beverages.    . Recommended pt to do annual eye exam and routine dental exams/cleanings  . Depression, alcohol, fall screening completed as documented above and per flowsheets  . Reviewed Health  Maintenance:  Pt had TAH - no PAP needed, declined pelvic exam Health Maintenance  Topic Date Due  . COVID-19 Vaccine (1) Never done  . MAMMOGRAM  Never done  .  TETANUS/TDAP  07/26/2020 (Originally 03/09/1987)  . INFLUENZA VACCINE  10/31/2019  . COLONOSCOPY  02/14/2023  . HIV Screening  Completed      ICD-10-CM   1. Adult general medical exam  Z00.00 CBC with Differential/Platelet    COMPLETE METABOLIC PANEL WITH GFR    Lipid panel     TSH  2. Encounter for screening mammogram for breast cancer  Z12.31 MM 3D SCREEN BREAST BILATERAL  Pt requests Mammogram be at [facility] - order redone and will fax to [facility] imaging   CANCELED: MM 3D SCREEN BREAST BILATERAL  3. Class 2 obesity with body mass index (BMI) of 35.0 to 35.9 in adult, unspecified obesity type, unspecified whether serious comorbidity present  E39.5 COMPLETE METABOLIC PANEL WITH GFR   Z68.35 Lipid panel  Concern for metabolic syndrome with abd obesity - will see if any prediabetes, or abnormal lipids  TSH   extensive counseling about diet/lifestyle/exercise for obesity and for weight loss, encouraged her to decrease portions, observe caloric/nutrition intake, encouraged her to increase physical exercise and activity as able.  We discussed having her reach out to her employer insurance program for any weight or nutrition resources that she may have and to let me know if she would like a referral to a dietitian.  Encouraged her to return for a weight management appointment to discuss other med options   4. Essential hypertension, benign  I10 hydrochlorothiazide (HYDRODIURIL) 12.5 MG tablet    COMPLETE METABOLIC PANEL WITH GFR   on and off HCTZ, pt prefers to go back on, helps with "fluid retention" some mild edema to b/l LE, discussed low salt diet, BP good today, well controlled and at goal  5. Screening for lipoid disorders  V20.233 COMPLETE METABOLIC PANEL WITH GFR    Lipid panel   obese, no prior lipids available in chart  6.  Screening for endocrine, metabolic and immunity disorder  I35.68 COMPLETE METABOLIC PANEL WITH GFR   Z13.228 Lipid panel   Z13.0 TSH  7. Constipation, unspecified constipation type  Has tried miralax and metamucil -encouraged her to first work on food elimination to see if she has any particular sensitivities causing bloating and GI upset also to work on treating likely GERD gastritis or peptic ulcer disease and then months she has less bloating and pain can work on increasing fiber fluids and treating constipation may also refer to GI for further evaluation  We will check for hypothyroid since bowels were previously frequent and loose to watery   K59.00 TSH  8. Epigastric fullness   Suspect upper GI etiology - 2-4 weeks trial of daily PPI/protonix w/follow up in next 1-2 months  R19.06 pantoprazole (PROTONIX) 20 MG tablet  9. Weight gain  R/o hypothyroid Likely some due to menopause, sedentary lifestyle    R63.5 TSH  10. Irritable bowel syndrome, unspecified type  K58.9    long hx of stool changes with food sensitivities and some changes with stress, previously had chronic diarrhea, now constipated/bloated  Given resources on FODMAP and IBS diet and lifestyle for her to start working on elimination/changes etc.  Can refer to GI if no improvement overall in her sx in the next month       Delsa Grana, PA-C 07/27/19 9:21 AM  [place]

## 2019-07-27 NOTE — Patient Instructions (Addendum)
Preventive Care 40-52 Years Old, Female Preventive care refers to visits with your health care provider and lifestyle choices that can promote health and wellness. This includes:  A yearly physical exam. This may also be called an annual well check.  Regular dental visits and eye exams.  Immunizations.  Screening for certain conditions.  Healthy lifestyle choices, such as eating a healthy diet, getting regular exercise, not using drugs or products that contain nicotine and tobacco, and limiting alcohol use. What can I expect for my preventive care visit? Physical exam Your health care provider will check your:  Height and weight. This may be used to calculate body mass index (BMI), which tells if you are at a healthy weight.  Heart rate and blood pressure.  Skin for abnormal spots. Counseling Your health care provider may ask you questions about your:  Alcohol, tobacco, and drug use.  Emotional well-being.  Home and relationship well-being.  Sexual activity.  Eating habits.  Work and work environment.  Method of birth control.  Menstrual cycle.  Pregnancy history. What immunizations do I need?  Influenza (flu) vaccine  This is recommended every year. Tetanus, diphtheria, and pertussis (Tdap) vaccine  You may need a Td booster every 10 years. Varicella (chickenpox) vaccine  You may need this if you have not been vaccinated. Zoster (shingles) vaccine  You may need this after age 60. Measles, mumps, and rubella (MMR) vaccine  You may need at least one dose of MMR if you were born in 1957 or later. You may also need a second dose. Pneumococcal conjugate (PCV13) vaccine  You may need this if you have certain conditions and were not previously vaccinated. Pneumococcal polysaccharide (PPSV23) vaccine  You may need one or two doses if you smoke cigarettes or if you have certain conditions. Meningococcal conjugate (MenACWY) vaccine  You may need this if you  have certain conditions. Hepatitis A vaccine  You may need this if you have certain conditions or if you travel or work in places where you may be exposed to hepatitis A. Hepatitis B vaccine  You may need this if you have certain conditions or if you travel or work in places where you may be exposed to hepatitis B. Haemophilus influenzae type b (Hib) vaccine  You may need this if you have certain conditions. Human papillomavirus (HPV) vaccine  If recommended by your health care provider, you may need three doses over 6 months. You may receive vaccines as individual doses or as more than one vaccine together in one shot (combination vaccines). Talk with your health care provider about the risks and benefits of combination vaccines. What tests do I need? Blood tests  Lipid and cholesterol levels. These may be checked every 5 years, or more frequently if you are over 52 years old.  Hepatitis C test.  Hepatitis B test. Screening  Lung cancer screening. You may have this screening every year starting at age 52 if you have a 30-pack-year history of smoking and currently smoke or have quit within the past 15 years.  Colorectal cancer screening. All adults should have this screening starting at age 52 and continuing until age 75. Your health care provider may recommend screening at age 52 if you are at increased risk. You will have tests every 1-10 years, depending on your results and the type of screening test.  Diabetes screening. This is done by checking your blood sugar (glucose) after you have not eaten for a while (fasting). You may have this   done every 1-3 years.  Mammogram. This may be done every 1-2 years. Talk with your health care provider about when you should start having regular mammograms. This may depend on whether you have a family history of breast cancer.  BRCA-related cancer screening. This may be done if you have a family history of breast, ovarian, tubal, or peritoneal  cancers.  Pelvic exam and Pap test. This may be done every 3 years starting at age 52. Starting at age 52, this may be done every 5 years if you have a Pap test in combination with an HPV test. Other tests  Sexually transmitted disease (STD) testing.  Bone density scan. This is done to screen for osteoporosis. You may have this scan if you are at high risk for osteoporosis. Follow these instructions at home: Eating and drinking  Eat a diet that includes fresh fruits and vegetables, whole grains, lean protein, and low-fat dairy.  Take vitamin and mineral supplements as recommended by your health care provider.  Do not drink alcohol if: ? Your health care provider tells you not to drink. ? You are pregnant, may be pregnant, or are planning to become pregnant.  If you drink alcohol: ? Limit how much you have to 0-1 drink a day. ? Be aware of how much alcohol is in your drink. In the [place] one drink equals one 12 oz bottle of beer (355 mL), one 5 oz glass of wine (148 mL), or one 1 oz glass of hard liquor (44 mL). Lifestyle  Take daily care of your teeth and gums.  Stay active. Exercise for at least 30 minutes on 5 or more days each week.  Do not use any products that contain nicotine or tobacco, such as cigarettes, e-cigarettes, and chewing tobacco. If you need help quitting, ask your health care provider.  If you are sexually active, practice safe sex. Use a condom or other form of birth control (contraception) in order to prevent pregnancy and STIs (sexually transmitted infections).  If told by your health care provider, take low-dose aspirin daily starting at age 52. What's next?  Visit your health care provider once a year for a well check visit.  Ask your health care provider how often you should have your eyes and teeth checked.  Stay up to date on all vaccines. This information is not intended to replace advice given to you by your health care provider. Make sure you  discuss any questions you have with your health care provider. Document Revised: 11/27/2017 Document Reviewed: 11/27/2017 Elsevier Patient Education  [place].   Preventing Osteoporosis, Adult  -  Calcium 1200 mg and Vit D 1000 IU daily  Osteoporosis is a condition that causes the bones to lose density. This means that the bones become thinner, and the normal spaces in bone tissue become larger. Low bone density can make the bones weak and cause them to break more easily. Osteoporosis cannot always be prevented, but you can take steps to lower your risk of developing this condition. How can this condition affect me? If you develop osteoporosis, you will be more likely to break bones in your wrist, spine, or hip. Even a minor accident or injury can be enough to break weak bones. The bones will also be slower to heal. Osteoporosis can cause other problems as well, such as a stooped posture or trouble with movement. Osteoporosis can occur with aging. As you get older, you may lose bone tissue more quickly, or it may  be replaced more slowly. Osteoporosis is more likely to develop if you have poor nutrition or do not get enough calcium or vitamin D. Other lifestyle factors can also play a role. By eating a well-balanced diet and making lifestyle changes, you can help keep your bones strong and healthy, lowering your chances of developing osteoporosis. What can increase my risk? The following factors may make you more likely to develop osteoporosis:  Having a family history of the condition.  Having poor nutrition or not getting enough calcium or vitamin D.  Using certain medicines, such as steroid medicines or antiseizure medicines.  Being any of the following: ? 19 years of age or older. ? Female. ? A woman who has gone through menopause (is postmenopausal). ? White (Caucasian) or of Asian descent.  Smoking or having a history of smoking.  Not being physically active (being  sedentary).  Having a small body frame. What actions can I take to prevent this?  Get enough calcium   Make sure you get enough calcium every day. Calcium is the most important mineral for bone health. Most people can get enough calcium from their diet, but supplements may be recommended for people who are at risk for osteoporosis. Follow these guidelines: ? If you are age 20 or younger, aim to get 1,000 mg of calcium every day. ? If you are older than age 28, aim to get 1,200 mg of calcium every day.  Good sources of calcium include: ? Dairy products, such as low-fat or nonfat milk, cheese, and yogurt. ? Dark green leafy vegetables, such as bok choy and broccoli. ? Foods that have had calcium added to them (calcium-fortified foods), such as orange juice, cereal, bread, soy beverages, and tofu products. ? Nuts, such as almonds.  Check nutrition labels to see how much calcium is in a food or drink. Get enough vitamin D  Try to get enough vitamin D every day. Vitamin D is the most essential vitamin for bone health. It helps the body absorb calcium. Follow these guidelines for how much vitamin D to get from food: ? If you are age 10 or younger, aim to get at least 600 international units (IU) every day. Your health care provider may suggest more. ? If you are older than age 40, aim to get at least 800 international units every day. Your health care provider may suggest more.  Good sources of vitamin D in your diet include: ? Egg yolks. ? Oily fish, such as salmon, sardines, and tuna. ? Milk and cereal fortified with vitamin D.  Your body also makes vitamin D when you are out in the sun. Exposing the bare skin on your face, arms, legs, or back to the sun for no more than 30 minutes a day, 2 times a week is more than enough. Beyond that, make sure you use sunblock to protect your skin from sunburn, which increases your risk for skin cancer. Exercise  Stay active and get exercise every  day.  Ask your health care provider what types of exercise are best for you. Weight-bearing and strength-building activities are important for building and maintaining healthy bones. Some examples of these types of activities include: ? Walking and hiking. ? Jogging and running. ? Dancing. ? Gym exercises. ? Lifting weights. ? Tennis and racquetball. ? Climbing stairs. ? Aerobics. Make other lifestyle changes  Do not use any products that contain nicotine or tobacco, such as cigarettes, e-cigarettes, and chewing tobacco. If you need help quitting,  ask your health care provider.  Lose weight if you are overweight.  If you drink alcohol: ? Limit how much you use to:  0-1 drink a day for nonpregnant women.  0-2 drinks a day for men. ? Be aware of how much alcohol is in your drink. In the [place] one drink equals one 12 oz bottle of beer (355 mL), one 5 oz glass of wine (148 mL), or one 1 oz glass of hard liquor (44 mL). Where to find support If you need help making changes to prevent osteoporosis, talk with your health care provider. You can ask for a referral to a diet and nutrition specialist (dietitian) and a physical therapist. Where to find more information Learn more about osteoporosis from:  NIH Osteoporosis and Related [place]: www.bones.SouthExposed.es  [place] Office on [employer]: [place].tn  [place]: EquipmentWeekly.com.ee Summary  Osteoporosis is a condition that causes weak bones that are more likely to break.  Eat a healthy diet, making sure you get enough calcium and vitamin D, and stay active by getting regular exercise to help prevent osteoporosis.  Other ways to reduce your risk of osteoporosis include maintaining a healthy weight and avoiding alcohol and products that contain nicotine or tobacco. This information is not intended to replace advice given to you by your health care provider. Make sure you discuss  any questions you have with your health care provider. Document Revised: 10/16/2018 Document Reviewed: 10/16/2018 Elsevier Patient Education  [place].   Diet for Irritable Bowel Syndrome When you have irritable bowel syndrome (IBS), it is very important to eat the foods and follow the eating habits that are best for your condition. IBS may cause various symptoms such as pain in the abdomen, constipation, or diarrhea. Choosing the right foods can help to ease the discomfort from these symptoms. Work with your health care provider and diet and nutrition specialist (dietitian) to find the eating plan that will help to control your symptoms. What are tips for following this plan?      Keep a food diary. This will help you identify foods that cause symptoms. Write down: ? What you eat and when you eat it. ? What symptoms you have. ? When symptoms occur in relation to your meals, such as "pain in abdomen 2 hours after dinner."  Eat your meals slowly and in a relaxed setting.  Aim to eat 5-6 small meals per day. Do not skip meals.  Drink enough fluid to keep your urine pale yellow.  Ask your health care provider if you should take an over-the-counter probiotic to help restore healthy bacteria in your gut (digestive tract). ? Probiotics are foods that contain good bacteria and yeasts.  Your dietitian may have specific dietary recommendations for you based on your symptoms. He or she may recommend that you: ? Avoid foods that cause symptoms. Talk with your dietitian about other ways to get the same nutrients that are in those problem foods. ? Avoid foods with gluten. Gluten is a protein that is found in rye, wheat, and barley. ? Eat more foods that contain soluble fiber. Examples of foods with high soluble fiber include oats, seeds, and certain fruits and vegetables. Take a fiber supplement if directed by your dietitian. ? Reduce or avoid certain foods called FODMAPs. These are foods  that contain carbohydrates that are hard to digest. Ask your doctor which foods contain these carbohydrates. What foods are not recommended? The following are some foods  and drinks that may make your symptoms worse:  Fatty foods, such as french fries.  Foods that contain gluten, such as pasta and cereal.  Dairy products, such as milk, cheese, and ice cream.  Chocolate.  Alcohol.  Products with caffeine, such as coffee.  Carbonated drinks, such as soda.  Foods that are high in FODMAPs. These include certain fruits and vegetables.  Products with sweeteners such as honey, high fructose corn syrup, sorbitol, and mannitol. The items listed above may not be a complete list of foods and beverages you should avoid. Contact a dietitian for more information. What foods are good sources of fiber? Your health care provider or dietitian may recommend that you eat more foods that contain fiber. Fiber can help to reduce constipation and other IBS symptoms. Add foods with fiber to your diet a little at a time so your body can get used to them. Too much fiber at one time might cause gas and swelling of your abdomen. The following are some foods that are good sources of fiber:  Berries, such as raspberries, strawberries, and blueberries.  Tomatoes.  Carrots.  Brown rice.  Oats.  Seeds, such as chia and pumpkin seeds. The items listed above may not be a complete list of recommended sources of fiber. Contact your dietitian for more options. Where to find more information  International Foundation for Functional Gastrointestinal Disorders: www.iffgd.[employer] of Diabetes and Digestive and Kidney Diseases: [place].dk Summary  When you have irritable bowel syndrome (IBS), it is very important to eat the foods and follow the eating habits that are best for your condition.  IBS may cause various symptoms such as pain in the abdomen, constipation, or diarrhea.  Choosing the  right foods can help to ease the discomfort that comes from symptoms.  Keep a food diary. This will help you identify foods that cause symptoms.  Your health care provider or diet and nutrition specialist (dietitian) may recommend that you eat more foods that contain fiber. This information is not intended to replace advice given to you by your health care provider. Make sure you discuss any questions you have with your health care provider. Document Revised: 07/08/2018 Document Reviewed: 11/19/2016 Elsevier Patient Education  [place]  FODMAPs (fermentable oligosaccharides, disaccharides, monosaccharides, and polyols) are sugars that are hard for some people to digest. A low-FODMAP eating plan may help some people who have bowel (intestinal) diseases to manage their symptoms. This meal plan can be complicated to follow. Work with a diet and nutrition specialist (dietitian) to make a low-FODMAP eating plan that is right for you. A dietitian can make sure that you get enough nutrition from this diet. What are tips for following this plan? Reading food labels  Check labels for hidden FODMAPs such as: ? High-fructose syrup. ? Honey. ? Agave. ? Natural fruit flavors. ? Onion or garlic powder.  Choose low-FODMAP foods that contain 3-4 grams of fiber per serving.  Check food labels for serving sizes. Eat only one serving at a time to make sure FODMAP levels stay low. Meal planning  Follow a low-FODMAP eating plan for up to 6 weeks, or as told by your health care provider or dietitian.  To follow the eating plan: 1. Eliminate high-FODMAP foods from your diet completely. 2. Gradually reintroduce high-FODMAP foods into your diet one at a time. Most people should wait a few days after introducing one high-FODMAP food before they introduce the next high-FODMAP  food. Your dietitian can recommend how quickly you may reintroduce foods. 3. Keep a daily record of  what you eat and drink, and make note of any symptoms that you have after eating. 4. Review your daily record with a dietitian regularly. Your dietitian can help you identify which foods you can eat and which foods you should avoid. General tips  Drink enough fluid each day to keep your urine pale yellow.  Avoid processed foods. These often have added sugar and may be high in FODMAPs.  Avoid most dairy products, whole grains, and sweeteners.  Work with a dietitian to make sure you get enough fiber in your diet. Recommended foods Grains  Gluten-free grains, such as rice, oats, buckwheat, quinoa, corn, polenta, and millet. Gluten-free pasta, bread, or cereal. Rice noodles. Corn tortillas. Vegetables  Eggplant, zucchini, cucumber, peppers, green beans, Brussels sprouts, bean sprouts, lettuce, arugula, kale, Swiss chard, spinach, collard greens, bok choy, summer squash, potato, and tomato. Limited amounts of corn, carrot, and sweet potato. Green parts of scallions. Fruits  Bananas, oranges, lemons, limes, blueberries, raspberries, strawberries, grapes, cantaloupe, honeydew melon, kiwi, papaya, passion fruit, and pineapple. Limited amounts of dried cranberries, banana chips, and shredded coconut. Dairy  Lactose-free milk, yogurt, and kefir. Lactose-free cottage cheese and ice cream. Non-dairy milks, such as almond, coconut, hemp, and rice milk. Yogurts made of non-dairy milks. Limited amounts of goat cheese, brie, mozzarella, parmesan, swiss, and other hard cheeses. Meats and other protein foods  Unseasoned beef, pork, poultry, or fish. Eggs. Berniece Salines. Tofu (firm) and tempeh. Limited amounts of nuts and seeds, such as almonds, walnuts, [place] nuts, pecans, peanuts, pumpkin seeds, chia seeds, and sunflower seeds. Fats and oils  Butter-free spreads. Vegetable oils, such as olive, canola, and sunflower oil. Seasoning and other foods  Artificial sweeteners with names that do not end in "ol" such  as aspartame, saccharine, and stevia. Maple syrup, white table sugar, raw sugar, brown sugar, and molasses. Fresh basil, coriander, parsley, rosemary, and thyme. Beverages  Water and mineral water. Sugar-sweetened soft drinks. Small amounts of orange juice or cranberry juice. Black and green tea. Most dry wines. Coffee. This may not be a complete list of low-FODMAP foods. Talk with your dietitian for more information. Foods to avoid Grains  Wheat, including kamut, durum, and semolina. Barley and bulgur. Couscous. Wheat-based cereals. Wheat noodles, bread, crackers, and pastries. Vegetables  Chicory root, artichoke, asparagus, cabbage, snow peas, sugar snap peas, mushrooms, and cauliflower. Onions, garlic, leeks, and the white part of scallions. Fruits  Fresh, dried, and juiced forms of apple, pear, watermelon, peach, plum, cherries, apricots, blackberries, boysenberries, figs, nectarines, and mango. Avocado. Dairy  Milk, yogurt, ice cream, and soft cheese. Cream and sour cream. Milk-based sauces. Custard. Meats and other protein foods  Fried or fatty meat. Sausage. Cashews and pistachios. Soybeans, baked beans, black beans, chickpeas, kidney beans, fava beans, navy beans, lentils, and split peas. Seasoning and other foods  Any sugar-free gum or candy. Foods that contain artificial sweeteners such as sorbitol, mannitol, isomalt, or xylitol. Foods that contain honey, high-fructose corn syrup, or agave. Bouillon, vegetable stock, beef stock, and chicken stock. Garlic and onion powder. Condiments made with onion, such as hummus, chutney, pickles, relish, salad dressing, and salsa. Tomato paste. Beverages  Chicory-based drinks. Coffee substitutes. Chamomile tea. Fennel tea. Sweet or fortified wines such as port or sherry. Diet soft drinks made with isomalt, mannitol, maltitol, sorbitol, or xylitol. Apple, pear, and mango juice. Juices with high-fructose corn syrup. This may not  be a complete  list of high-FODMAP foods. Talk with your dietitian to discuss what dietary choices are best for you.  Summary  A low-FODMAP eating plan is a short-term diet that eliminates FODMAPs from your diet to help ease symptoms of certain bowel diseases.  The eating plan usually lasts up to 6 weeks. After that, high-FODMAP foods are restarted gradually, one at a time, so you can find out which may be causing symptoms.  A low-FODMAP eating plan can be complicated. It is best to work with a dietitian who has experience with this type of plan. This information is not intended to replace advice given to you by your health care provider. Make sure you discuss any questions you have with your health care provider. Document Revised: 02/28/2017 Document Reviewed: 11/12/2016 Elsevier Patient Education  [place].

## 2019-07-28 LAB — COMPLETE METABOLIC PANEL WITH GFR
AG Ratio: 1.6 (calc) (ref 1.0–2.5)
ALT: 16 U/L (ref 6–29)
AST: 16 U/L (ref 10–35)
Albumin: 4.3 g/dL (ref 3.6–5.1)
Alkaline phosphatase (APISO): 66 U/L (ref 37–153)
BUN: 14 mg/dL (ref 7–25)
CO2: 29 mmol/L (ref 20–32)
Calcium: 9 mg/dL (ref 8.6–10.4)
Chloride: 104 mmol/L (ref 98–110)
Creat: 0.73 mg/dL (ref 0.50–1.05)
GFR, Est African American: 111 mL/min/{1.73_m2} (ref >=60)
GFR, Est Non African American: 95 mL/min/{1.73_m2} (ref >=60)
Globulin: 2.7 g/dL (calc) (ref 1.9–3.7)
Glucose, Bld: 96 mg/dL (ref 65–99)
Potassium: 4.1 mmol/L (ref 3.5–5.3)
Sodium: 140 mmol/L (ref 135–146)
Total Bilirubin: 1 mg/dL (ref 0.2–1.2)
Total Protein: 7 g/dL (ref 6.1–8.1)

## 2019-07-28 LAB — CBC WITH DIFFERENTIAL/PLATELET
Absolute Monocytes: 448 cells/uL (ref 200–950)
Basophils Absolute: 13 cells/uL (ref 0–200)
Basophils Relative: 0.2 %
Eosinophils Absolute: 58 cells/uL (ref 15–500)
Eosinophils Relative: 0.9 %
HCT: 36.8 % (ref 35.0–45.0)
Hemoglobin: 12.7 g/dL (ref 11.7–15.5)
Lymphs Abs: 2163 cells/uL (ref 850–3900)
MCH: 29.2 pg (ref 27.0–33.0)
MCHC: 34.5 g/dL (ref 32.0–36.0)
MCV: 84.6 fL (ref 80.0–100.0)
MPV: 11.8 fL (ref 7.5–12.5)
Monocytes Relative: 7 %
Neutro Abs: 3718 cells/uL (ref 1500–7800)
Neutrophils Relative %: 58.1 %
Platelets: 266 10*3/uL (ref 140–400)
RBC: 4.35 10*6/uL (ref 3.80–5.10)
RDW: 13.1 % (ref 11.0–15.0)
Total Lymphocyte: 33.8 %
WBC: 6.4 10*3/uL (ref 3.8–10.8)

## 2019-07-28 LAB — LIPID PANEL
Cholesterol: 213 mg/dL — ABNORMAL HIGH (ref <200)
HDL: 70 mg/dL (ref >=50)
LDL Cholesterol (Calc): 125 mg/dL (calc) — ABNORMAL HIGH
Non-HDL Cholesterol (Calc): 143 mg/dL (calc) — ABNORMAL HIGH (ref <130)
Total CHOL/HDL Ratio: 3 (calc) (ref <5.0)
Triglycerides: 82 mg/dL (ref <150)

## 2019-07-28 LAB — TSH: TSH: 1.55 mIU/L

## 2019-07-30 DIAGNOSIS — Z1231 Encounter for screening mammogram for malignant neoplasm of breast: Secondary | ICD-10-CM | POA: Diagnosis not present

## 2019-08-04 DIAGNOSIS — Z23 Encounter for immunization: Secondary | ICD-10-CM | POA: Diagnosis not present

## 2019-09-01 DIAGNOSIS — Z23 Encounter for immunization: Secondary | ICD-10-CM | POA: Diagnosis not present

## 2019-09-09 DIAGNOSIS — N390 Urinary tract infection, site not specified: Secondary | ICD-10-CM | POA: Diagnosis not present

## 2019-10-13 IMAGING — US US ABDOMEN COMPLETE
1 series · 14 of 25 positions shown · non-contrast
Comparison: CT abdomen and pelvis 03/29/2016

CLINICAL DATA: Hiatal hernia. Generalized abdominal pain. Pain with
deep inspiration.

EXAM:
ABDOMEN ULTRASOUND COMPLETE

[Series 1: us abdomen complete · 0.20mm/px · 14 of 104 slices shown]
[im 1/104]
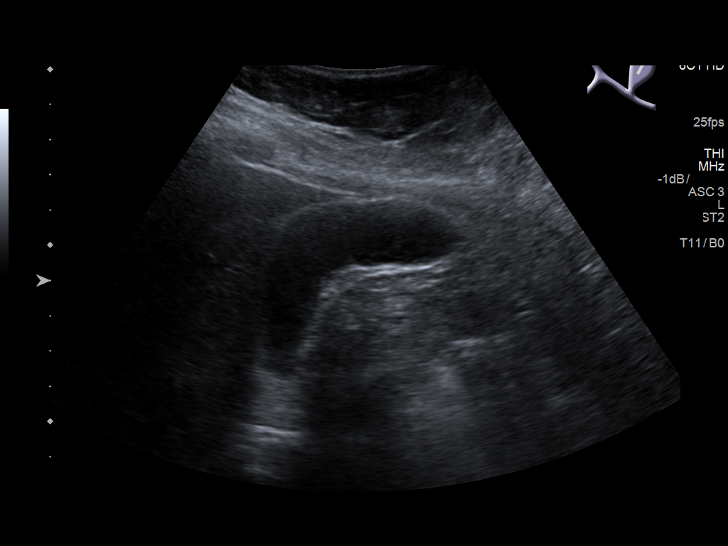
[im 9/104]
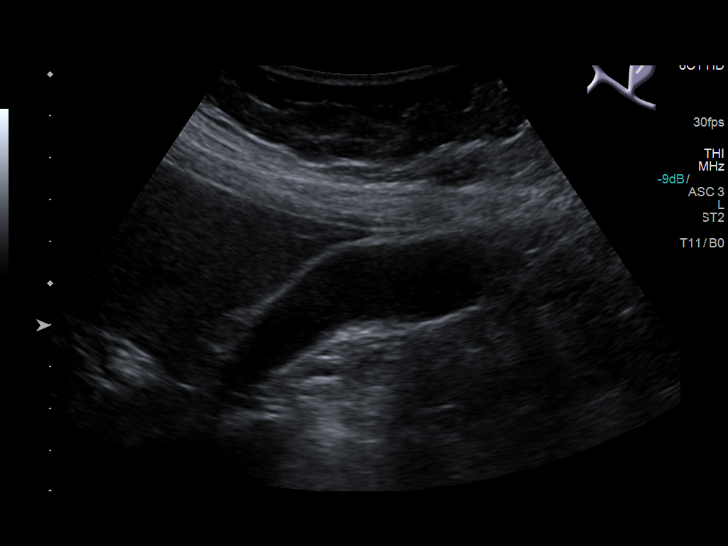
[im 18/104]
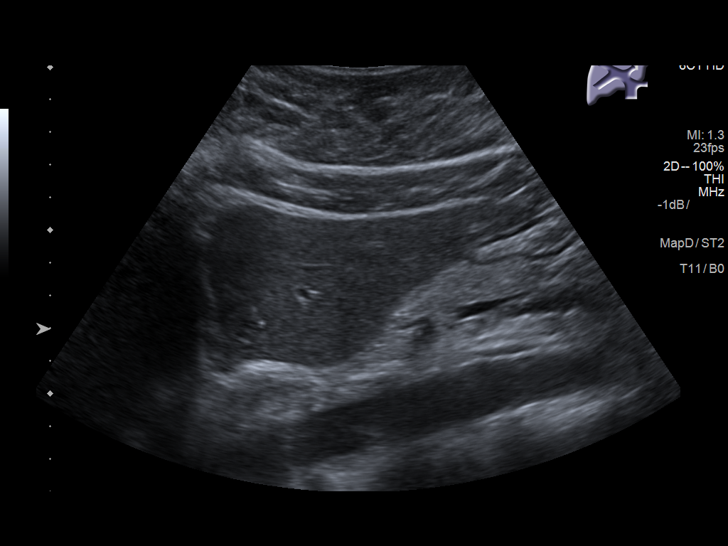
[im 26/104]
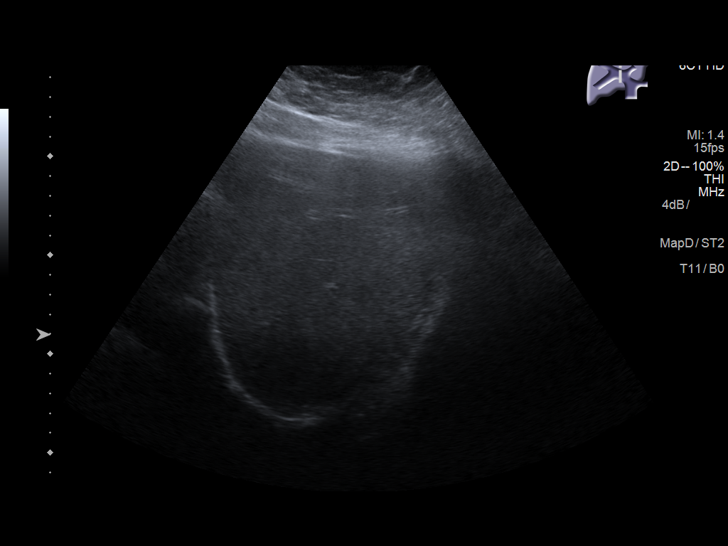
[im 35/104]
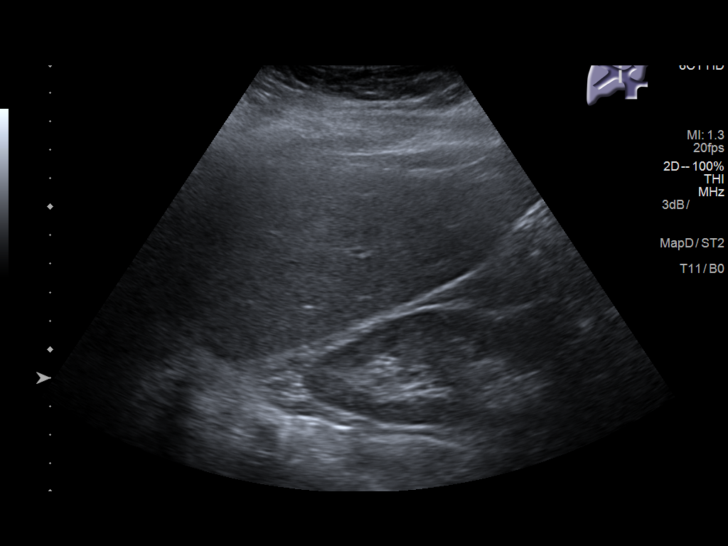
[im 39/104]
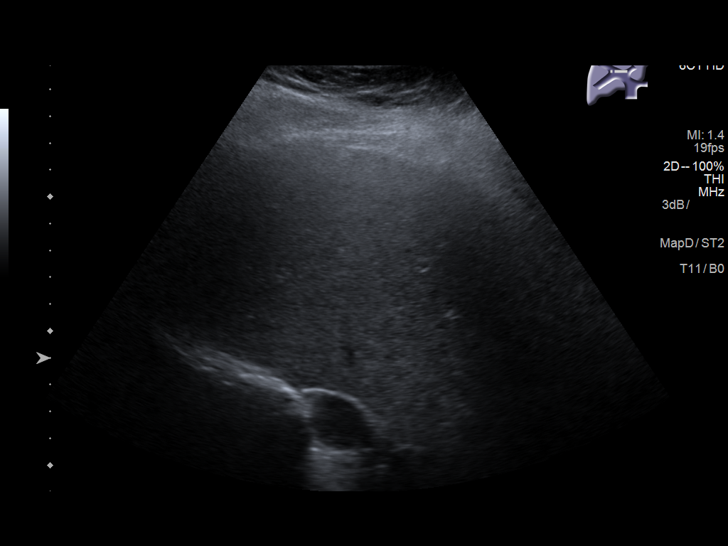
[im 48/104]
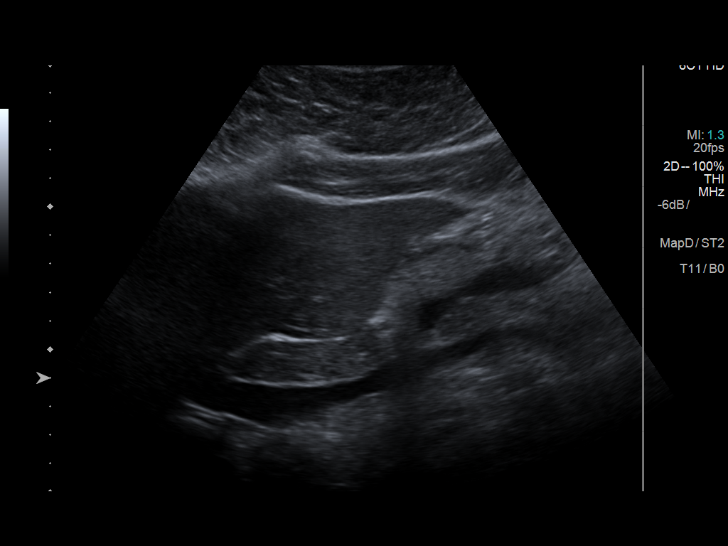
[im 56/104]
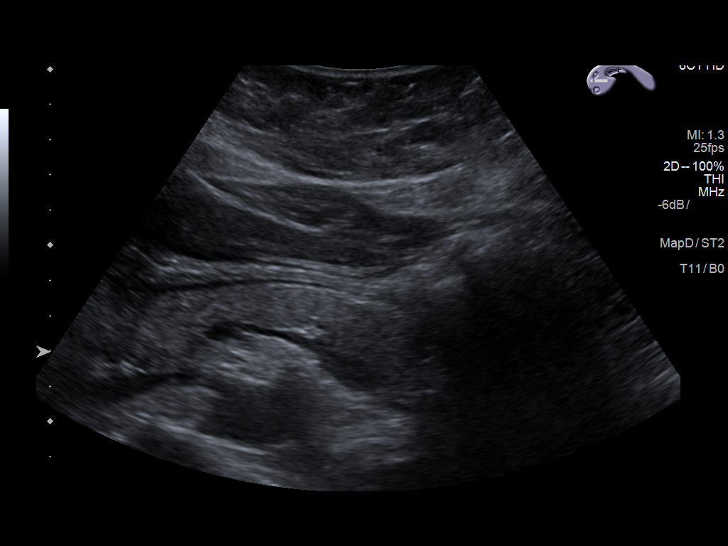
[im 65/104]
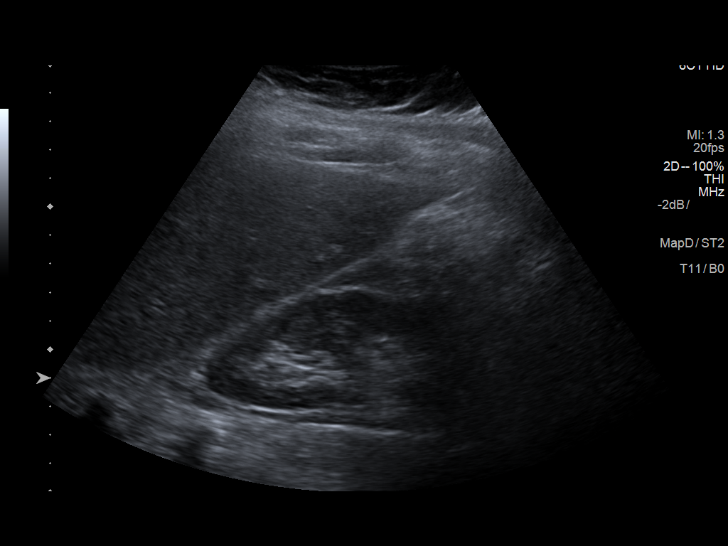
[im 69/104]
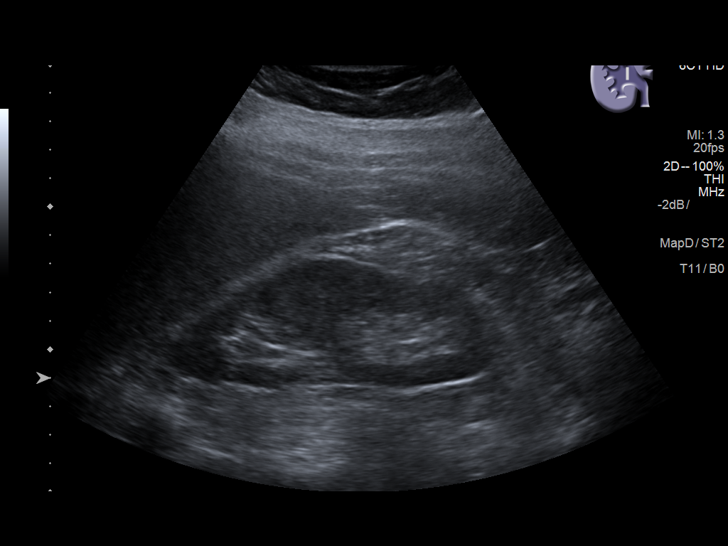
[im 78/104]
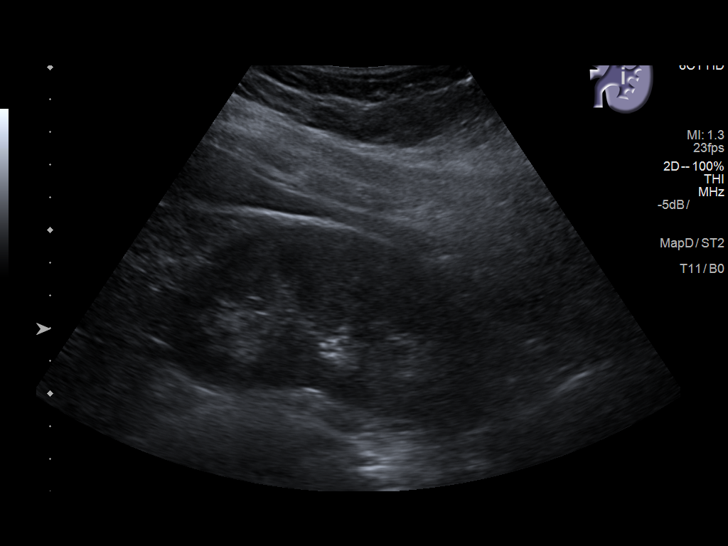
[im 86/104]
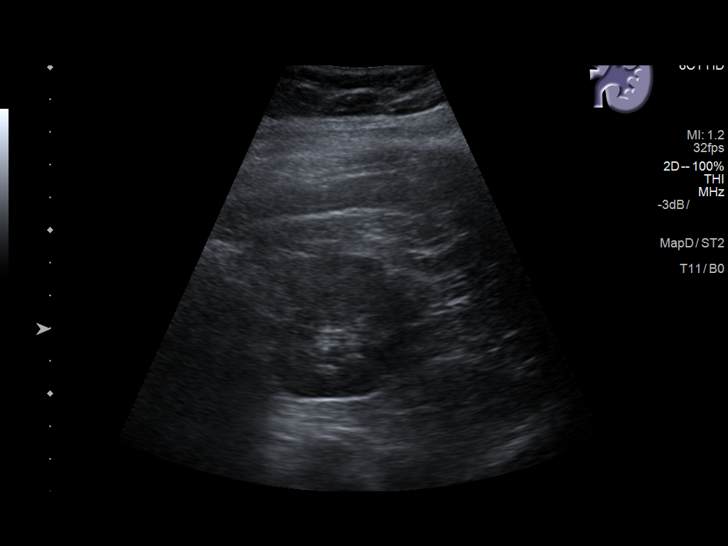
[im 95/104]
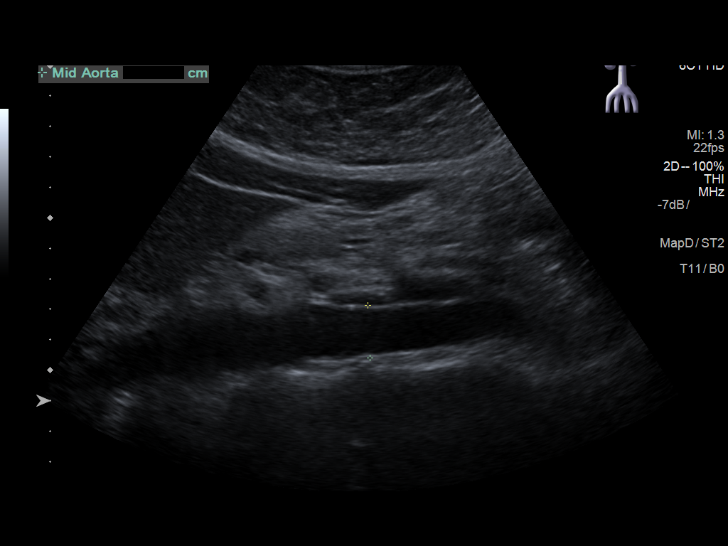
[im 104/104]
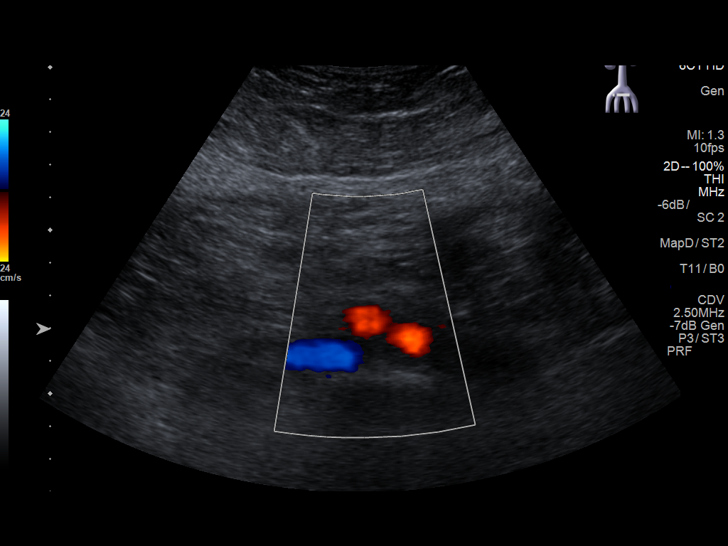

[14 of 25 positions shown; findings below may reference images not displayed]

FINDINGS: Gallbladder: No gallstones or wall thickening visualized. No
sonographic Murphy sign noted by sonographer. Maximal wall thickness
is within normal limits at 1.9 mm

Common bile duct: Diameter: 2.0 mm, within normal limits

Liver: No focal lesion identified. Within normal limits in
parenchymal echogenicity. Portal vein is patent on color Doppler
imaging with normal direction of blood flow towards the liver.

IVC: No abnormality visualized.

Pancreas: Visualized portion unremarkable.

Spleen: Size and appearance within normal limits.

Right Kidney: Length: 12.0 cm, within normal limits. Echogenicity
within normal limits. No mass or hydronephrosis visualized.

Left Kidney: Length: 11.4 cm, within normal limits. Echogenicity
within normal limits. No mass or hydronephrosis visualized.

Abdominal aorta: No aneurysm visualized.

Other findings: None.
IMPRESSION: Negative abdominal ultrasound.

## 2019-10-26 ENCOUNTER — Ambulatory Visit: Payer: BC Managed Care – PPO | Admitting: Family Medicine

## 2019-10-29 ENCOUNTER — Other Ambulatory Visit: Payer: Self-pay

## 2019-10-29 ENCOUNTER — Encounter: Payer: Self-pay | Admitting: Family Medicine

## 2019-10-29 ENCOUNTER — Telehealth (INDEPENDENT_AMBULATORY_CARE_PROVIDER_SITE_OTHER): Payer: BC Managed Care – PPO | Admitting: Family Medicine

## 2019-10-29 VITALS — Ht 65.0 in | Wt 220.0 lb

## 2019-10-29 DIAGNOSIS — R319 Hematuria, unspecified: Secondary | ICD-10-CM

## 2019-10-29 DIAGNOSIS — Z7689 Persons encountering health services in other specified circumstances: Secondary | ICD-10-CM

## 2019-10-29 DIAGNOSIS — E66812 Obesity, class 2: Secondary | ICD-10-CM

## 2019-10-29 DIAGNOSIS — E785 Hyperlipidemia, unspecified: Secondary | ICD-10-CM

## 2019-10-29 DIAGNOSIS — R1084 Generalized abdominal pain: Secondary | ICD-10-CM | POA: Diagnosis not present

## 2019-10-29 DIAGNOSIS — R140 Abdominal distension (gaseous): Secondary | ICD-10-CM | POA: Diagnosis not present

## 2019-10-29 DIAGNOSIS — Z6836 Body mass index (BMI) 36.0-36.9, adult: Secondary | ICD-10-CM

## 2019-10-29 DIAGNOSIS — E669 Obesity, unspecified: Secondary | ICD-10-CM

## 2019-10-29 MED ORDER — PHENTERMINE HCL 37.5 MG PO TABS: 37.5000 mg | ORAL_TABLET | Freq: Every day | ORAL | 0 refills | Status: DC

## 2019-10-29 NOTE — Progress Notes (Signed)
Name: Christy Craig   MRN: [id number]    DOB: Feb 10, 1966   Date:10/29/2019       Progress Note  Subjective:    Chief Complaint  Chief Complaint  Patient presents with  . Follow-up  . weight managment  . Gastroesophageal Reflux    I connected with  [place] Pink  on 10/29/19 at  8:40 AM EDT by a video enabled telemedicine application and verified that I am speaking with the correct person using two identifiers.  I discussed the limitations of evaluation and management by telemedicine and the availability of in person appointments. The patient expressed understanding and agreed to proceed. Staff also discussed with the patient that there may be a patient responsible charge related to this service. Patient Location: home Provider Location: out of town home visit Additional Individuals present: none  HPI  F/up on GERD - given protonix 2-3 months ago - discussed at CPE in April The protonix did help with Upper GI sx when she takes it, but she still is having bloating and constipation  She has seen GI, done upper endoscopy, has a hiatal hernia ([place]) (she would prefer to go back to [facility] or [place])  [place]   Some pain to right side to right lower back/flank area No urinary sx, but she has hx of UTI and often has some hematuria She has tx with antibiotics in the last month.  Initial antibiotic was changed after urine culture results Initially macrobid, then changed (pt doesn't know name, will need to call pharmacy)       Reviewed 07/27/2019 HPI: Upper GI/epigastric bloating/irritation, feels constipated - she is having BMs most days, but she feels like she never evacuates all the way.  She does feel full early even after taking a few bites of food she feels hungry but feels very full and bloated with epigastric burning and indigestion sometimes.  She does feel like she has soft formed stool feels like she has a bowel movement but then never fully gets out  everything.  She previously had frequent diarrhea for most of her life she felt that it was normal to eat and then immediately have a loose watery bowel movement.  She did go to GI in the past and she has had a upper GI before and was on acid reflux medicine in the past that she is not currently taking anything.  She does feel that she has had increased abdominal obesity and bloating especially in the past couple years.  She also presents for weigh check: Wt Readings from Last 5 Encounters:  10/29/19 (!) 220 lb (99.8 kg)  07/27/19 220 lb 12.8 oz (100.2 kg)  06/03/18 217 lb (98.4 kg)  12/30/17 217 lb (98.4 kg)  03/28/16 205 lb (93 kg)   BMI Readings from Last 5 Encounters:  10/29/19 36.61 kg/m  07/27/19 35.64 kg/m  06/03/18 35.02 kg/m  12/30/17 35.02 kg/m  03/28/16 33.09 kg/m   She feels extremely stressed with life and work.  She is willing to work on diet changes.   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 Hypertension 11/19/2017  . Fibroids 07/29/2012  . Hiatal hernia 07/29/2012  . Enteritis 07/27/2012  . Menorrhagia 07/10/2012  . Ileitis, terminal ([place]) 06/24/2012  . Abdominal pain 06/24/2012  . Hypokalemia 06/24/2012  . Essential hypertension, benign 06/24/2012  . Obesity, unspecified 06/24/2012  . UTI (urinary tract infection) 06/24/2012  . Hyperglycemia 06/24/2012    Social History   Tobacco  Use  . Smoking status: Never Smoker  . Smokeless tobacco: Never Used  Substance Use Topics  . Alcohol use: No    Comment: social      Current Outpatient Medications:  .  hydrochlorothiazide (HYDRODIURIL) 12.5 MG tablet, Take 1 tablet (12.5 mg total) by mouth daily., Disp: 90 tablet, Rfl: 3 .  pantoprazole (PROTONIX) 20 MG tablet, Take 1 tablet (20 mg total) by mouth daily., Disp: 30 tablet, Rfl: 2 .  valACYclovir (VALTREX) 500 MG tablet, Take 1 tablet (500 mg total) by mouth daily as needed (for flares)., Disp: 90 tablet, Rfl: 1  Allergies  Allergen Reactions  . Lisinopril Cough   I personally reviewed active problem list, medication list, allergies, family history, social history, health maintenance, notes from last encounter, lab results, imaging with the patient/caregiver today.   Review of Systems  10 Systems reviewed and are negative for acute change except as noted in the HPI.   Objective:   Virtual encounter, vitals limited, only able to obtain the following Today's Vitals   10/29/19 0830  Weight: (!) 220 lb (99.8 kg)  Height: 5\' 5"  (1.651 m)   Body mass index is 36.61 kg/m. Nursing Note and Vital Signs reviewed.  Physical Exam Pt alert, pleasant, phonation clear, NAD PE limited by telephone encounter  No results found for this or any previous visit (from the past 72 hour(s)).  Assessment and Plan:     ICD-10-CM   1. Generalized abdominal pain  R10.84 US Abdomen Limited RUQ   generalized worse with eating, bloating, N, fullness, some constipation - some improvement with PPI, RUQ US/ r/o urinary involvement, refer to GI if workup neg  2. Postprandial abdominal bloating  R14.0 US Abdomen Limited RUQ   continue PPI for severe upper GI/epigastric sx, - 2-4 week tx, other abdominal fullness, N, bloating IBS? PUD? food sensitivity? r/o cholestasis, ref to GI  3. Class 2 obesity with body mass index (BMI) of 36.0 to 36.9 in adult, unspecified obesity type,  unspecified whether serious comorbidity present  E66.9    Z68.36    see below, pt has sedentary lifestyle, works a lot, diet overall sounds fairly well balanced, may need to track calories, reduce, increase fruit/veggies  4. Encounter for weight management  Z76.89    discussed nutrition, calorie reduction, increasing exercise, phentermine tx for 3-4 month, discussed SE/risk/benefit with pt  5. Hyperlipidemia, unspecified hyperlipidemia type  E78.5    pt instructed to work on diet for HLD, avoid trans fats and saturated fats  6. Hematuria, unspecified type  R31.9    recurrent with UTI? not able to see encounters in chart - pt will check dip at work and let us know if still + for blood     -Red flags and when to present for emergency care or RTC including fever >  101.68F, chest pain, shortness of breath, new/worsening/un-resolving symptoms, reviewed with patient at time of visit. Follow up and care instructions discussed and provided in AVS. - I discussed the assessment and treatment plan with the patient. The patient was provided an opportunity to ask questions and all were answered. The patient agreed with the plan and demonstrated an understanding of the instructions.  I provided 45+ minutes of non-face-to-face time during this encounter. 30 minutes with pt on encounter, remaining for chart review, charting, arranging f/up, prescriptions, tests/imaging etc.  Delsa Grana, PA-C 10/29/19 9:12 AM

## 2019-11-03 ENCOUNTER — Ambulatory Visit: Payer: BC Managed Care – PPO

## 2019-11-03 DIAGNOSIS — R319 Hematuria, unspecified: Secondary | ICD-10-CM | POA: Diagnosis not present

## 2019-11-03 DIAGNOSIS — M545 Low back pain: Secondary | ICD-10-CM | POA: Diagnosis not present

## 2019-11-09 DIAGNOSIS — Z20828 Contact with and (suspected) exposure to other viral communicable diseases: Secondary | ICD-10-CM | POA: Diagnosis not present

## 2019-11-25 NOTE — Progress Notes (Signed)
Name: Christy Craig   MRN: [id number]    DOB: 1965/09/28   Date:11/26/2019       Progress Note  Subjective:    Chief Complaint  Chief Complaint  Patient presents with  . Ear Pain    right with headache lasted 2 weeks went away yesterday.  Has been taking sudafed    I connected with  [place] Pink  on 11/26/19 at  1:40 PM EDT by a video enabled telemedicine application and verified that I am speaking with the correct person using two identifiers.  I discussed the limitations of evaluation and management by telemedicine and the availability of in person appointments. The patient expressed understanding and agreed to proceed. Staff also discussed with the patient that there may be a patient responsible charge related to this service. Patient Location: home Provider Location: [facility] clinic Additional Individuals present: none  She went on vacation 2 weeks ago had sinus sx and HA, ear pain nose pain, and she did test for COVID and it was   Otalgia  There is pain in the right ear. The current episode started 1 to 4 weeks ago (2 weeks ago after going on vacation).   Doing flonase - and she tried using sudafed and it "dried her up and her sx improved" She has hx of sinus issues She often has drainage, easily get sinus headaches and infeciton, sometimes HA's are so bad she has N/V associated with it She is not on a antihistamine No hx of asthma or eczema   Patient Active Problem List   Diagnosis Date Noted  . Epigastric fullness 11/26/2019  . 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 Hypertension 11/19/2017  . Fibroids 07/29/2012  . Hiatal hernia 07/29/2012  . Enteritis 07/27/2012  . Menorrhagia 07/10/2012  . Ileitis, terminal ([place]) 06/24/2012  . Abdominal pain 06/24/2012  . Hypokalemia 06/24/2012  . Essential hypertension, benign 06/24/2012  . Obesity, unspecified 06/24/2012  . UTI (urinary tract infection) 06/24/2012  . Hyperglycemia 06/24/2012    Social History    Tobacco Use  . Smoking status: Never Smoker  . Smokeless tobacco: Never Used  Substance Use Topics  . Alcohol use: No    Comment: social      Current Outpatient Medications:  .  hydrochlorothiazide (HYDRODIURIL) 12.5 MG tablet, Take 1 tablet (12.5 mg total) by mouth daily., Disp: 90 tablet, Rfl: 3 .  pantoprazole (PROTONIX) 20 MG tablet, Take 1 tablet (20 mg total) by mouth daily., Disp: 30 tablet, Rfl: 2 .  phentermine (ADIPEX-P) 37.5 MG tablet, Take 1 tablet (37.5 mg total) by mouth daily before breakfast., Disp: 30 tablet, Rfl: 0 .  valACYclovir (VALTREX) 500 MG tablet, Take 1 tablet (500 mg total) by mouth daily as needed (for flares)., Disp: 90 tablet, Rfl: 1 .  levocetirizine (XYZAL) 5 MG tablet, Take 1 tablet (5 mg total) by mouth every evening., Disp: 30 tablet, Rfl: 2  Allergies  Allergen Reactions  . Lisinopril Cough    I personally reviewed active problem list, medication list, allergies, family history, social history, health maintenance, notes from last encounter, lab results, imaging with the patient/caregiver today.   Review of Systems  Constitutional: Negative.   HENT: Positive for ear pain.   Eyes: Negative.   Respiratory: Negative.   Cardiovascular: Negative.   Gastrointestinal: Negative.   Endocrine: Negative.   Genitourinary: Negative.   Musculoskeletal: Negative.   Skin: Negative.   Allergic/Immunologic: Negative.   Neurological: Negative.   Hematological: Negative.   Psychiatric/Behavioral:  Negative.   All other systems reviewed and are negative.     Objective:   Virtual encounter, vitals limited, only able to obtain the following Today's Vitals   11/26/19 1105  Weight: 220 lb (99.8 kg)  Height: 5\' 5"  (1.651 m)   Body mass index is 36.61 kg/m. Nursing Note and Vital Signs reviewed.  Physical Exam Vitals and nursing note reviewed.  Constitutional:      General: She is not in acute distress.    Appearance: Normal appearance. She is  obese. She is not ill-appearing, toxic-appearing or diaphoretic.  Pulmonary:     Effort: No respiratory distress.  Neurological:     Mental Status: She is alert.  Psychiatric:        Mood and Affect: Mood normal.        Behavior: Behavior normal.     PE limited by telephone encounter  No results found for this or any previous visit (from the past 72 hour(s)).  Assessment and Plan:     ICD-10-CM   1. Right ear pain  H92.01    sx for 2 weeks but now improved - suspect eustachian tube dysfunction - advised continuing flonase, add 24 hour antihistamine  2. Upper respiratory tract infection, unspecified type  J06.9    acute sx onset over 2 weeks ago, tested neg for covid, sx now completely improved     -Red flags and when to present for emergency care or RTC including fever >101.68F, chest pain, shortness of breath, new/worsening/un-resolving symptoms, reviewed with patient at time of visit. Follow up and care instructions discussed and provided in AVS. - I discussed the assessment and treatment plan with the patient. The patient was provided an opportunity to ask questions and all were answered. The patient agreed with the plan and demonstrated an understanding of the instructions.  I provided 20+ minutes of non-face-to-face time during this encounter.  Delsa Grana, PA-C 11/26/19 2:02 PM

## 2019-11-26 ENCOUNTER — Encounter: Payer: Self-pay | Admitting: Family Medicine

## 2019-11-26 ENCOUNTER — Telehealth (INDEPENDENT_AMBULATORY_CARE_PROVIDER_SITE_OTHER): Payer: BC Managed Care – PPO | Admitting: Family Medicine

## 2019-11-26 VITALS — Ht 65.0 in | Wt 220.0 lb

## 2019-11-26 DIAGNOSIS — H9201 Otalgia, right ear: Secondary | ICD-10-CM

## 2019-11-26 DIAGNOSIS — R1906 Epigastric swelling, mass or lump: Secondary | ICD-10-CM | POA: Insufficient documentation

## 2019-11-26 DIAGNOSIS — J069 Acute upper respiratory infection, unspecified: Secondary | ICD-10-CM | POA: Diagnosis not present

## 2019-11-26 MED ORDER — LEVOCETIRIZINE DIHYDROCHLORIDE 5 MG PO TABS: 5.0000 mg | ORAL_TABLET | Freq: Every evening | ORAL | 2 refills | Status: DC

## 2019-12-16 ENCOUNTER — Encounter: Payer: Self-pay | Admitting: Family Medicine

## 2019-12-16 ENCOUNTER — Other Ambulatory Visit: Payer: Self-pay

## 2019-12-16 ENCOUNTER — Ambulatory Visit (INDEPENDENT_AMBULATORY_CARE_PROVIDER_SITE_OTHER): Payer: BC Managed Care – PPO | Admitting: Family Medicine

## 2019-12-16 VITALS — BP 122/76 | HR 82 | Temp 98.3°F | Resp 16 | Ht 65.0 in | Wt 212.0 lb

## 2019-12-16 DIAGNOSIS — Z6835 Body mass index (BMI) 35.0-35.9, adult: Secondary | ICD-10-CM

## 2019-12-16 DIAGNOSIS — Z7689 Persons encountering health services in other specified circumstances: Secondary | ICD-10-CM

## 2019-12-16 DIAGNOSIS — Z760 Encounter for issue of repeat prescription: Secondary | ICD-10-CM

## 2019-12-16 MED ORDER — PHENTERMINE HCL 37.5 MG PO TABS: 37.5000 mg | ORAL_TABLET | Freq: Every day | ORAL | 0 refills | Status: DC

## 2019-12-16 MED ORDER — PANTOPRAZOLE SODIUM 20 MG PO TBEC
20.0000 mg | DELAYED_RELEASE_TABLET | ORAL | 5 refills | Status: DC
Start: 2019-12-16 — End: 2020-08-15

## 2019-12-16 NOTE — Progress Notes (Signed)
Name: Christy Craig   MRN: [id number]    DOB: Dec 01, 1965   Date:12/16/2019       Progress Note  Chief Complaint  Patient presents with  . Obesity     Subjective:   Christy Craig is a 54 y.o. female, presents to clinic for weight check and med check Tried phentermine about 2 months ago Taking 1/2 tablet Has helped curb appetite Sometimes dry mouth Effect has gotten less over the last two months  She's lost weight 8 lbs Wt Readings from Last 5 Encounters:  12/16/19 212 lb (96.2 kg)  11/26/19 220 lb (99.8 kg)  10/29/19 (!) 220 lb (99.8 kg)  07/27/19 220 lb 12.8 oz (100.2 kg)  06/03/18 217 lb (98.4 kg)   BMI Readings from Last 5 Encounters:  12/16/19 35.28 kg/m  11/26/19 36.61 kg/m  10/29/19 36.61 kg/m  07/27/19 35.64 kg/m  06/03/18 35.02 kg/m   Diet/nutrition:  Curbed appetite- trying to eat healthy  Exercising:  She is working out   LE edema - she takes HCTZ prn - not every day    Current Outpatient Medications:  .  hydrochlorothiazide (HYDRODIURIL) 12.5 MG tablet, Take 1 tablet (12.5 mg total) by mouth daily., Disp: 90 tablet, Rfl: 3 .  phentermine (ADIPEX-P) 37.5 MG tablet, Take 1 tablet (37.5 mg total) by mouth daily before breakfast., Disp: 30 tablet, Rfl: 0 .  valACYclovir (VALTREX) 500 MG tablet, Take 1 tablet (500 mg total) by mouth daily as needed (for flares)., Disp: 90 tablet, Rfl: 1 .  fluconazole (DIFLUCAN) 150 MG tablet, Take 150 mg by mouth once. (Patient not taking: Reported on 12/16/2019), Disp: , Rfl:  .  levocetirizine (XYZAL) 5 MG tablet, Take 1 tablet (5 mg total) by mouth every evening. (Patient not taking: Reported on 12/16/2019), Disp: 30 tablet, Rfl: 2 .  pantoprazole (PROTONIX) 20 MG tablet, Take 1 tablet (20 mg total) by mouth daily. (Patient not taking: Reported on 12/16/2019), Disp: 30 tablet, Rfl: 2  Patient Active Problem List   Diagnosis Date Noted  . Epigastric fullness 11/26/2019  . 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  Hypertension 11/19/2017  . Fibroids 07/29/2012  . Hiatal hernia 07/29/2012  . Enteritis 07/27/2012  . Menorrhagia 07/10/2012  . Ileitis, terminal ([place]) 06/24/2012  . Abdominal pain 06/24/2012  . Hypokalemia 06/24/2012  . Essential hypertension, benign 06/24/2012  . Class 2 severe obesity with serious comorbidity and body mass index (BMI) of 35.0 to 35.9 in adult [facility]) 06/24/2012  . UTI (urinary tract infection) 06/24/2012  . Hyperglycemia 06/24/2012    Past Surgical History:  Procedure Laterality Date  . ABDOMINAL HYSTERECTOMY    . CESAREAN SECTION    . CYST EXCISION  1988   removed from buttock  . CYST REMOVAL HAND  1991  . PARTIAL HYSTERECTOMY  2019  . TUBAL LIGATION      Family History  Problem Relation Age of Onset  . Hypertension Sister   . Kidney failure Father   . Migraines Sister   . Ulcers Sister     Social History   Tobacco Use  . Smoking status: Never Smoker  . Smokeless tobacco: Never Used  Vaping Use  . Vaping Use: Never used  Substance Use Topics  . Alcohol use: No    Comment: social   . Drug use: No     Allergies  Allergen Reactions  . Clindamycin     Other reaction(s): GI Problems  . Lisinopril Cough    Health Maintenance  Topic Date Due  . Hepatitis C Screening  Never done  . COVID-19 Vaccine (1) Never done  . MAMMOGRAM  Never done  . INFLUENZA VACCINE  Never done  . TETANUS/TDAP  07/26/2020 (Originally 03/09/1987)  . COLONOSCOPY  02/14/2023  . HIV Screening  Completed    Chart Review Today: I personally reviewed active problem list, medication list, allergies, family history, social history, health maintenance, notes from last encounter, lab results, imaging with the patient/caregiver today.   Review of Systems  10 Systems reviewed and are negative for acute change except as noted in the HPI.  Objective:   Vitals:   12/16/19 1404 12/16/19 1711  BP: 122/76   Pulse: 100 82  Resp: 16   Temp: 98.3 F (36.8 C)   TempSrc: Oral    SpO2: 95%   Weight: 212 lb (96.2 kg)   Height: 5\' 5"  (1.651 m)     Body mass index is 35.28 kg/m.  Physical Exam Vitals and nursing note reviewed.  Constitutional:      Appearance: She is well-developed. She is obese.  HENT:     Head: Normocephalic and atraumatic.     Right Ear: External ear normal.     Left Ear: External ear normal.     Nose: Nose normal.  Eyes:     General:        Right eye: No discharge.        Left eye: No discharge.     Conjunctiva/sclera: Conjunctivae normal.  Neck:     Trachea: No tracheal deviation.  Cardiovascular:     Rate and Rhythm: Normal rate and regular rhythm.  Pulmonary:     Effort: Pulmonary effort is normal. No respiratory distress.     Breath sounds: No stridor.  Musculoskeletal:        General: Normal range of motion.  Skin:    General: Skin is warm and dry.     Findings: No rash.  Neurological:     Mental Status: She is alert.     Motor: No abnormal muscle tone.     Coordination: Coordination normal.  Psychiatric:        Mood and Affect: Mood normal.        Behavior: Behavior normal.         Assessment & Plan:     ICD-10-CM   1. Class 2 severe obesity with serious comorbidity and body mass index (BMI) of 35.0 to 35.9 in adult, unspecified obesity type (HCC)  E66.01 phentermine (ADIPEX-P) 37.5 MG tablet   Z68.35    8 lbs down - continue diet and exercise efforts  2. Encounter for weight management  Z76.89 phentermine (ADIPEX-P) 37.5 MG tablet   controlled substance database reviewed, pt tolerating phentermine - can try full tablet, continue diet/lifestyle/exercise efforts, f/up 1 month  3. Medication refill  Z76.0 pantoprazole (PROTONIX) 20 MG tablet     Return in about 1 month (around 01/15/2020) for weight management f/up in office.   Delsa Grana, PA-C 12/16/19 2:19 PM

## 2019-12-16 NOTE — Patient Instructions (Signed)
Preventing Unhealthy Weight Gain, Adult Staying at a healthy weight is important to your overall health. When fat builds up in your body, you may become overweight or obese. Being overweight or obese increases your risk of developing certain health problems, such as heart disease, diabetes, sleeping problems, joint problems, and some types of cancer. Unhealthy weight gain is often the result of making unhealthy food choices or not getting enough exercise. You can make changes to your lifestyle to prevent obesity and stay as healthy as possible. What nutrition changes can be made?   Eat only as much as your body needs. To do this: ? Pay attention to signs that you are hungry or full. Stop eating as soon as you feel full. ? If you feel hungry, try drinking water first before eating. Drink enough water so your urine is clear or pale yellow. ? Eat smaller portions. Pay attention to portion sizes when eating out. ? Look at serving sizes on food labels. Most foods contain more than one serving per container. ? Eat the recommended number of calories for your gender and activity level. For most active people, a daily total of 2,000 calories is appropriate. If you are trying to lose weight or are not very active, you may need to eat fewer calories. Talk with your health care provider or a diet and nutrition specialist (dietitian) about how many calories you need each day.  Choose healthy foods, such as: ? Fruits and vegetables. At each meal, try to fill at least half of your plate with fruits and vegetables. ? Whole grains, such as whole-wheat bread, brown rice, and quinoa. ? Lean meats, such as chicken or fish. ? Other healthy proteins, such as beans, eggs, or tofu. ? Healthy fats, such as nuts, seeds, fatty fish, and olive oil. ? Low-fat or fat-free dairy products.  Check food labels, and avoid food and drinks that: ? Are high in calories. ? Have added sugar. ? Are high in sodium. ? Have saturated  fats or trans fats.  Cook foods in healthier ways, such as by baking, broiling, or grilling.  Make a meal plan for the week, and shop with a grocery list to help you stay on track with your purchases. Try to avoid going to the grocery store when you are hungry.  When grocery shopping, try to shop around the outside of the store first, where the fresh foods are. Doing this helps you to avoid prepackaged foods, which can be high in sugar, salt (sodium), and fat. What lifestyle changes can be made?   Exercise for 30 or more minutes on 5 or more days each week. Exercising may include brisk walking, yard work, biking, running, swimming, and team sports like basketball and soccer. Ask your health care provider which exercises are safe for you.  Do muscle-strengthening activities, such as lifting weights or using resistance bands, on 2 or more days a week.  Do not use any products that contain nicotine or tobacco, such as cigarettes and e-cigarettes. If you need help quitting, ask your health care provider.  Limit alcohol intake to no more than 1 drink a day for nonpregnant women and 2 drinks a day for men. One drink equals 12 oz of beer, 5 oz of wine, or 1 oz of hard liquor.  Try to get 7-9 hours of sleep each night. What other changes can be made?  Keep a food and activity journal to keep track of: ? What you ate and how many calories   you had. Remember to count the calories in sauces, dressings, and side dishes. ? Whether you were active, and what exercises you did. ? Your calorie, weight, and activity goals.  Check your weight regularly. Track any changes. If you notice you have gained weight, make changes to your diet or activity routine.  Avoid taking weight-loss medicines or supplements. Talk to your health care provider before starting any new medicine or supplement.  Talk to your health care provider before trying any new diet or exercise plan. Why are these changes  important? Eating healthy, staying active, and having healthy habits can help you to prevent obesity. Those changes also:  Help you manage stress and emotions.  Help you connect with friends and family.  Improve your self-esteem.  Improve your sleep.  Prevent long-term health problems. What can happen if changes are not made? Being obese or overweight can cause you to develop joint or bone problems, which can make it hard for you to stay active or do activities you enjoy. Being obese or overweight also puts stress on your heart and lungs and can lead to health problems like diabetes, heart disease, and some cancers. Where to find more information Talk with your health care provider or a dietitian about healthy eating and healthy lifestyle choices. You may also find information from:  U.S. Department of Agriculture, MyPlate: www.choosemyplate.gov  American Heart Association: www.heart.org  Centers for Disease Control and Prevention: www.cdc.gov Summary  Staying at a healthy weight is important to your overall health. It helps you to prevent certain diseases and health problems, such as heart disease, diabetes, joint problems, sleep disorders, and some types of cancer.  Being obese or overweight can cause you to develop joint or bone problems, which can make it hard for you to stay active or do activities you enjoy.  You can prevent unhealthy weight gain by eating a healthy diet, exercising regularly, not smoking, limiting alcohol, and getting enough sleep.  Talk with your health care provider or a dietitian for guidance about healthy eating and healthy lifestyle choices. This information is not intended to replace advice given to you by your health care provider. Make sure you discuss any questions you have with your health care provider. Document Revised: 03/21/2017 Document Reviewed: 04/24/2016 Elsevier Patient Education  2020 [employer]  

## 2020-01-18 ENCOUNTER — Ambulatory Visit: Payer: BC Managed Care – PPO | Admitting: Family Medicine

## 2020-01-18 ENCOUNTER — Other Ambulatory Visit: Payer: Self-pay

## 2020-01-18 ENCOUNTER — Encounter: Payer: Self-pay | Admitting: Family Medicine

## 2020-01-18 VITALS — BP 118/84 | HR 82 | Temp 98.3°F | Resp 16 | Ht 65.0 in | Wt 209.0 lb

## 2020-01-18 DIAGNOSIS — Z7689 Persons encountering health services in other specified circumstances: Secondary | ICD-10-CM

## 2020-01-18 DIAGNOSIS — K219 Gastro-esophageal reflux disease without esophagitis: Secondary | ICD-10-CM

## 2020-01-18 DIAGNOSIS — R600 Localized edema: Secondary | ICD-10-CM

## 2020-01-18 DIAGNOSIS — I10 Essential (primary) hypertension: Secondary | ICD-10-CM

## 2020-01-18 DIAGNOSIS — Z6834 Body mass index (BMI) 34.0-34.9, adult: Secondary | ICD-10-CM

## 2020-01-18 DIAGNOSIS — E669 Obesity, unspecified: Secondary | ICD-10-CM | POA: Diagnosis not present

## 2020-01-18 MED ORDER — PHENTERMINE HCL 37.5 MG PO TABS: 37.5000 mg | ORAL_TABLET | Freq: Every day | ORAL | 0 refills | Status: DC

## 2020-01-18 NOTE — Progress Notes (Signed)
Patient ID: Christy Craig, female    DOB: 1966/02/12, 54 y.o.   MRN: [id number]  PCP: Delsa Grana, PA-C  Chief Complaint  Patient presents with  . Weight Check    Subjective:   Christy Craig is a 54 y.o. female, presents to clinic for weight check and med check She started phentermine half dose by the end of July It is still helping to curb her appetite she has tried the higher dose, sometimes is still doing 1/2 tablet which does not seem to be as effective as it was previously.  She does still have dry mouth.    Wt Readings from Last 5 Encounters:  01/18/20 209 lb (94.8 kg)  12/16/19 212 lb (96.2 kg)  11/26/19 220 lb (99.8 kg)  10/29/19 (!) 220 lb (99.8 kg)  07/27/19 220 lb 12.8 oz (100.2 kg)   BMI Readings from Last 5 Encounters:  01/18/20 34.78 kg/m  12/16/19 35.28 kg/m  11/26/19 36.61 kg/m  10/29/19 36.61 kg/m  07/27/19 35.64 kg/m   She has lost a few more pounds since last office visit  Diet/nutrition:   Still trying to eat healthy Exercising:   Walking a lot at work says that she walks about 1 day a week separate from her work duties  LE edema -improved not taking hydrochlorothiazide very often  PDMP checked today  7/30 phentermine rx 37.5 #30 and 9/16 same  Gerd: She still has some pressure bloating and indigestion across her upper abdomen that comes and goes.  She states she has not been taking Protonix daily and just takes 1 pill occasionally here and there.  No other medications over-the-counter  Patient Active Problem List   Diagnosis Date Noted  . Epigastric fullness 11/26/2019  . 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 Hypertension 11/19/2017  . Fibroids 07/29/2012  . Hiatal hernia 07/29/2012  . Enteritis 07/27/2012  . Menorrhagia 07/10/2012  . Ileitis, terminal ([place]) 06/24/2012  . Abdominal pain 06/24/2012  . Hypokalemia 06/24/2012  . Essential hypertension, benign 06/24/2012  . Class 2 severe obesity with serious comorbidity and  body mass index (BMI) of 35.0 to 35.9 in adult [facility]) 06/24/2012  . UTI (urinary tract infection) 06/24/2012  . Hyperglycemia 06/24/2012      Current Outpatient Medications:  .  hydrochlorothiazide (HYDRODIURIL) 12.5 MG tablet, Take 1 tablet (12.5 mg total) by mouth daily., Disp: 90 tablet, Rfl: 3 .  pantoprazole (PROTONIX) 20 MG tablet, Take 1 tablet (20 mg total) by mouth every morning., Disp: 30 tablet, Rfl: 5 .  phentermine (ADIPEX-P) 37.5 MG tablet, Take 1 tablet (37.5 mg total) by mouth daily before breakfast., Disp: 30 tablet, Rfl: 0 .  valACYclovir (VALTREX) 500 MG tablet, Take 1 tablet (500 mg total) by mouth daily as needed (for flares)., Disp: 90 tablet, Rfl: 1   Allergies  Allergen Reactions  . Clindamycin     Other reaction(s): GI Problems  . Lisinopril Cough     Social History   Tobacco Use  . Smoking status: Never Smoker  . Smokeless tobacco: Never Used  Vaping Use  . Vaping Use: Never used  Substance Use Topics  . Alcohol use: No    Comment: social   . Drug use: No      Chart Review Today: I personally reviewed active problem list, medication list, allergies, family history, social history, health maintenance, notes from last encounter, lab results, imaging with the patient/caregiver today.   Review of Systems  Constitutional: Negative.   HENT: Negative.  Eyes: Negative.   Respiratory: Negative.   Cardiovascular: Negative.   Gastrointestinal: Negative.  Negative for blood in stool, constipation, diarrhea, nausea and vomiting.  Endocrine: Negative.   Genitourinary: Negative.   Musculoskeletal: Negative.   Skin: Negative.   Allergic/Immunologic: Negative.   Neurological: Negative.   Hematological: Negative.   Psychiatric/Behavioral: Negative.   All other systems reviewed and are negative.  10 Systems reviewed and are negative for acute change except as noted in the HPI.     Objective:   Vitals:   01/18/20 1404  BP: 118/84  Pulse: 82    Resp: 16  Temp: 98.3 F (36.8 C)  TempSrc: Oral  SpO2: 99%  Weight: 209 lb (94.8 kg)  Height: 5\' 5"  (1.651 m)    Body mass index is 34.78 kg/m.  Physical Exam Vitals and nursing note reviewed.  Constitutional:      General: She is not in acute distress.    Appearance: Normal appearance. She is well-developed. She is obese. She is not ill-appearing, toxic-appearing or diaphoretic.     Interventions: Face mask in place.  HENT:     Head: Normocephalic and atraumatic.     Right Ear: External ear normal.     Left Ear: External ear normal.  Eyes:     General: Lids are normal. No scleral icterus.       Right eye: No discharge.        Left eye: No discharge.     Conjunctiva/sclera: Conjunctivae normal.  Neck:     Trachea: Phonation normal. No tracheal deviation.  Cardiovascular:     Rate and Rhythm: Normal rate and regular rhythm.     Pulses: Normal pulses.          Radial pulses are 2+ on the right side and 2+ on the left side.       Posterior tibial pulses are 2+ on the right side and 2+ on the left side.     Heart sounds: Normal heart sounds. No murmur heard.  No friction rub. No gallop.   Pulmonary:     Effort: Pulmonary effort is normal. No respiratory distress.     Breath sounds: Normal breath sounds. No stridor. No wheezing, rhonchi or rales.  Chest:     Chest wall: No tenderness.  Abdominal:     General: Bowel sounds are normal. There is no distension.     Palpations: Abdomen is soft.     Tenderness: There is no abdominal tenderness. There is no guarding or rebound.  Musculoskeletal:     Right lower leg: No edema.     Left lower leg: No edema.  Skin:    General: Skin is warm and dry.     Coloration: Skin is not jaundiced or pale.     Findings: No rash.  Neurological:     Mental Status: She is alert.     Motor: No abnormal muscle tone.     Gait: Gait normal.  Psychiatric:        Mood and Affect: Mood normal.        Speech: Speech normal.        Behavior:  Behavior normal.      Results for orders placed or performed in visit on 07/27/19  CBC with Differential/Platelet  Result Value Ref Range   WBC 6.4 3.8 - 10.8 Thousand/uL   RBC 4.35 3.80 - 5.10 Million/uL   Hemoglobin 12.7 11.7 - 15.5 g/dL   HCT 36.8 35 - 45 %  MCV 84.6 80.0 - 100.0 fL   MCH 29.2 27.0 - 33.0 pg   MCHC 34.5 32.0 - 36.0 g/dL   RDW 13.1 11.0 - 15.0 %   Platelets 266 140 - 400 Thousand/uL   MPV 11.8 7.5 - 12.5 fL   Neutro Abs 3,718 1,500 - 7,800 cells/uL   Lymphs Abs 2,163 850 - 3,900 cells/uL   Absolute Monocytes 448 200 - 950 cells/uL   Eosinophils Absolute 58 15.0 - 500.0 cells/uL   Basophils Absolute 13 0.0 - 200.0 cells/uL   Neutrophils Relative % 58.1 %   Total Lymphocyte 33.8 %   Monocytes Relative 7.0 %   Eosinophils Relative 0.9 %   Basophils Relative 0.2 %  COMPLETE METABOLIC PANEL WITH GFR  Result Value Ref Range   Glucose, Bld 96 65 - 99 mg/dL   BUN 14 7 - 25 mg/dL   Creat 0.73 0.50 - 1.05 mg/dL   GFR, Est Non African American 95 > OR = 60 mL/min/1.50m2   GFR, Est African American 111 > OR = 60 mL/min/1.55m2   BUN/Creatinine Ratio NOT APPLICABLE 6 - 22 (calc)   Sodium 140 135 - 146 mmol/L   Potassium 4.1 3.5 - 5.3 mmol/L   Chloride 104 98 - 110 mmol/L   CO2 29 20 - 32 mmol/L   Calcium 9.0 8.6 - 10.4 mg/dL   Total Protein 7.0 6.1 - 8.1 g/dL   Albumin 4.3 3.6 - 5.1 g/dL   Globulin 2.7 1.9 - 3.7 g/dL (calc)   AG Ratio 1.6 1.0 - 2.5 (calc)   Total Bilirubin 1.0 0.2 - 1.2 mg/dL   Alkaline phosphatase (APISO) 66 37 - 153 U/L   AST 16 10 - 35 U/L   ALT 16 6 - 29 U/L  Lipid panel  Result Value Ref Range   Cholesterol 213 (H) <200 mg/dL   HDL 70 > OR = 50 mg/dL   Triglycerides 82 <150 mg/dL   LDL Cholesterol (Calc) 125 (H) mg/dL (calc)   Total CHOL/HDL Ratio 3.0 <5.0 (calc)   Non-HDL Cholesterol (Calc) 143 (H) <130 mg/dL (calc)  TSH  Result Value Ref Range   TSH 1.55 mIU/L       Assessment & Plan:     ICD-10-CM   1. Class 1 obesity  with body mass index (BMI) of 34.0 to 34.9 in adult, unspecified obesity type, unspecified whether serious comorbidity present  E66.9    Z68.34    down 11 lbs, encouraged her to try full tablet more often, increase exercise and continue to work on healthy diet habits  2. Encounter for weight management  Z76.89 phentermine (ADIPEX-P) 37.5 MG tablet   Still tolerating phentermine taking half tablet some days and full tablet other days, still experiencing dry mouth SE, heart rate and blood pressure normal  3. Gastroesophageal reflux disease, unspecified whether esophagitis present  K21.9    some bloating indigeston  4. Encounter for weight management  Z76.89 phentermine (ADIPEX-P) 37.5 MG tablet   controlled substance database reviewed, pt tolerating phentermine - try full tablet, continue diet/lifestyle/exercise efforts  5. Primary hypertension  I10    Stable, well-controlled with diet and lifestyle, HCTZ used as needed for edema, blood pressure at goal today  6. Bilateral leg edema  R60.0    Symptoms gradually improving, low-salt diet encouraged, avoid stasis and immobility, try to avoid HCTZ use    Reviewed phentermine use with the patient encouraged her to stop using if ineffective, reviewed how it is not safe  or effective if used for more than 4 months in a row.      Delsa Grana, PA-C 01/18/20 2:13 PM

## 2020-01-18 NOTE — Patient Instructions (Addendum)
Take protonix daily for at least 2 weeks and then stop - it can treat ulcers and inflammation and works better when taking 14 d at a time and not here and there You can try pepcid as needed 20 mg up to twice a day for intermittent symptoms  Keep working on your diet and exercise Keep adding in more activity in ways that you can at work or outside work  Try the full dose for the next month

## 2020-01-20 DIAGNOSIS — I10 Essential (primary) hypertension: Secondary | ICD-10-CM | POA: Diagnosis not present

## 2020-01-20 DIAGNOSIS — K219 Gastro-esophageal reflux disease without esophagitis: Secondary | ICD-10-CM | POA: Diagnosis not present

## 2020-01-20 DIAGNOSIS — M542 Cervicalgia: Secondary | ICD-10-CM | POA: Diagnosis not present

## 2020-01-20 DIAGNOSIS — M79622 Pain in left upper arm: Secondary | ICD-10-CM | POA: Diagnosis not present

## 2020-01-20 DIAGNOSIS — R0789 Other chest pain: Secondary | ICD-10-CM | POA: Diagnosis not present

## 2020-01-20 DIAGNOSIS — R079 Chest pain, unspecified: Secondary | ICD-10-CM | POA: Diagnosis not present

## 2020-01-20 DIAGNOSIS — Z79899 Other long term (current) drug therapy: Secondary | ICD-10-CM | POA: Diagnosis not present

## 2020-01-20 DIAGNOSIS — R072 Precordial pain: Secondary | ICD-10-CM | POA: Diagnosis not present

## 2020-01-20 DIAGNOSIS — Z8249 Family history of ischemic heart disease and other diseases of the circulatory system: Secondary | ICD-10-CM | POA: Diagnosis not present

## 2020-03-01 DIAGNOSIS — Z23 Encounter for immunization: Secondary | ICD-10-CM | POA: Diagnosis not present

## 2020-03-21 ENCOUNTER — Ambulatory Visit: Payer: BC Managed Care – PPO | Admitting: Family Medicine

## 2020-04-13 DIAGNOSIS — Z20828 Contact with and (suspected) exposure to other viral communicable diseases: Secondary | ICD-10-CM | POA: Diagnosis not present

## 2020-04-24 ENCOUNTER — Ambulatory Visit: Payer: BC Managed Care – PPO | Admitting: Family Medicine

## 2020-05-02 DIAGNOSIS — Z20828 Contact with and (suspected) exposure to other viral communicable diseases: Secondary | ICD-10-CM | POA: Diagnosis not present

## 2020-05-22 ENCOUNTER — Other Ambulatory Visit: Payer: Self-pay

## 2020-05-22 ENCOUNTER — Telehealth (INDEPENDENT_AMBULATORY_CARE_PROVIDER_SITE_OTHER): Payer: BC Managed Care – PPO | Admitting: Family Medicine

## 2020-05-22 ENCOUNTER — Encounter: Payer: Self-pay | Admitting: Family Medicine

## 2020-05-22 VITALS — BP 135/83 | Ht 65.0 in | Wt 206.0 lb

## 2020-05-22 DIAGNOSIS — Z7689 Persons encountering health services in other specified circumstances: Secondary | ICD-10-CM | POA: Diagnosis not present

## 2020-05-22 DIAGNOSIS — E669 Obesity, unspecified: Secondary | ICD-10-CM | POA: Diagnosis not present

## 2020-05-22 DIAGNOSIS — Z6834 Body mass index (BMI) 34.0-34.9, adult: Secondary | ICD-10-CM | POA: Diagnosis not present

## 2020-05-22 MED ORDER — PHENTERMINE HCL 37.5 MG PO TABS: 37.5000 mg | ORAL_TABLET | Freq: Every day | ORAL | 0 refills | Status: DC

## 2020-05-22 NOTE — Progress Notes (Signed)
Name: Christy Craig   MRN: [id number]    DOB: 03/21/1966   Date:05/22/2020       Progress Note  Subjective:    Chief Complaint  Chief Complaint  Patient presents with  . Obesity    Weight check    I connected with  [place] Pink  on 05/22/20 at 11:00 AM EST by a video enabled telemedicine application and verified that I am speaking with the correct person using two identifiers.  I discussed the limitations of evaluation and management by telemedicine and the availability of in person appointments. The patient expressed understanding and agreed to proceed. Staff also discussed with the patient that there may be a patient responsible charge related to this service. Patient Location: work (private area to talk) Provider Location: [facility] Additional Individuals present: none  HPI  Pt presents for weight loss She has been off phentermine for the past couple months and she has maintained her weight and even lost 3 pounds She has only taking 2 out of the 3 prescriptions that were given to her from last July till last November.  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 Epigastric fullness 11/26/2019  . 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 Hypertension 11/19/2017  . Fibroids 07/29/2012  . Hiatal hernia 07/29/2012  . Enteritis 07/27/2012  . Menorrhagia 07/10/2012  . Abdominal pain 06/24/2012  . Hypokalemia 06/24/2012  . Essential hypertension, benign 06/24/2012  . Class 2 severe obesity with serious comorbidity  and body mass index (BMI) of 35.0 to 35.9 in adult [facility]) 06/24/2012  . UTI (urinary tract infection) 06/24/2012  . Hyperglycemia 06/24/2012    Social History   Tobacco Use  . Smoking status: Never Smoker  . Smokeless tobacco: Never Used  Substance Use Topics  . Alcohol use: No    Comment: social      Current Outpatient Medications:  .  hydrochlorothiazide (HYDRODIURIL) 12.5 MG tablet, Take 1 tablet (12.5 mg total) by mouth daily., Disp: 90 tablet, Rfl: 3 .  nitrofurantoin (MACRODANTIN) 100 MG capsule, Take 100 mg by mouth 2 (two) times daily. For 10 days for ear infection, Disp: , Rfl:  .  pantoprazole (PROTONIX) 20 MG tablet, Take 1 tablet (20 mg total) by mouth every morning., Disp: 30 tablet, Rfl: 5 .  valACYclovir (VALTREX) 500 MG tablet, Take 1 tablet (500 mg total) by mouth daily as needed (for flares)., Disp: 90 tablet, Rfl: 1 .  phentermine (ADIPEX-P) 37.5 MG tablet, Take 1 tablet (37.5 mg total) by mouth daily before breakfast. (Patient not taking: Reported on 05/22/2020), Disp: 30 tablet, Rfl: 0  Allergies  Allergen Reactions  . Clindamycin     Other reaction(s): GI Problems  . Lisinopril Cough    I personally reviewed active problem list, medication list, allergies, family history, social history, health maintenance, notes from last encounter, lab results, imaging with the patient/caregiver today. Controlled substance database checked today  Review of Systems    Objective:   Virtual encounter,  vitals limited, only able to obtain the following Today's Vitals   05/22/20 0808  BP: 135/83  Weight: 206 lb (93.4 kg)  Height: 5\' 5"  (1.651 m)   Body mass index is 34.28 kg/m. Nursing Note and Vital Signs reviewed.  Physical Exam Vitals and nursing note reviewed.  Constitutional:      General: She is not in acute distress.    Appearance: Normal appearance. She is well-developed. She is obese. She is not ill-appearing, toxic-appearing or diaphoretic.  HENT:      Head: Normocephalic and atraumatic.  Eyes:     General:        Right eye: No discharge.        Left eye: No discharge.  Neck:     Trachea: No tracheal deviation.  Pulmonary:     Effort: Pulmonary effort is normal. No respiratory distress.     Breath sounds: No stridor.  Skin:    Coloration: Skin is not jaundiced or pale.     Findings: No rash.  Neurological:     Mental Status: She is alert.  Psychiatric:        Mood and Affect: Mood normal.        Behavior: Behavior normal.     PE limited by telephone encounter  No results found for this or any previous visit (from the past 72 hour(s)).  Assessment and Plan:     ICD-10-CM   1. Encounter for weight management  Z76.89 phentermine (ADIPEX-P) 37.5 MG tablet   controlled substance database reviewed, pt tolerating phentermine - took several month breat, restart, continue diet/lifestyle/exercise efforts  2. Class 1 obesity with body mass index (BMI) of 34.0 to 34.9 in adult, unspecified obesity type, unspecified whether serious comorbidity present  E66.9    Z68.34    weight stable, down 3 lbs - continue healthy habits, diet, exercise, we will continue to monitor and support    Return for 6-8 week f/up in office for weight/med check.   - I discussed the assessment and treatment plan with the patient. The patient was provided an opportunity to ask questions and all were answered. The patient agreed with the plan and demonstrated an understanding of the instructions.  I provided 20 minutes of non-face-to-face time during this encounter.  Delsa Grana, PA-C 05/22/20 11:04 AM

## 2020-07-19 ENCOUNTER — Other Ambulatory Visit: Payer: Self-pay | Admitting: Family Medicine

## 2020-07-19 DIAGNOSIS — Z7689 Persons encountering health services in other specified circumstances: Secondary | ICD-10-CM

## 2020-07-19 NOTE — Telephone Encounter (Signed)
Requested medication (s) are due for refill today: yes   Requested medication (s) are on the active medication list: yes   Last refill: 05/22/2020  Future visit scheduled:yes   Notes to clinic:  this refill cannot be delegated    Requested Prescriptions  Pending Prescriptions Disp Refills   phentermine (ADIPEX-P) 37.5 MG tablet 30 tablet 0    Sig: Take 1 tablet (37.5 mg total) by mouth daily before breakfast.      There is no refill protocol information for this order

## 2020-07-19 NOTE — Telephone Encounter (Signed)
You said to f/u in 6-8 weeks, she has an appt for 5/28

## 2020-07-19 NOTE — Telephone Encounter (Signed)
Copied from [place] [id number]. Topic: Quick Communication - Rx Refill/Question >> Jul 19, 2020  1:10 PM Mcneil, Ja-Kwan wrote: Medication: phentermine (ADIPEX-P) 37.5 MG tablet  Has the patient contacted their pharmacy? yes - Pt told to call provider  Preferred Pharmacy (with phone number or street name): [facility] [place]  Phone: [phone]   Fax: [phone]  Agent: Please be advised that RX refills may take up to 3 business days. We ask that you follow-up with your pharmacy.

## 2020-07-25 ENCOUNTER — Ambulatory Visit: Payer: BC Managed Care – PPO | Admitting: Family Medicine

## 2020-07-28 ENCOUNTER — Telehealth: Payer: Self-pay

## 2020-07-28 ENCOUNTER — Other Ambulatory Visit: Payer: Self-pay

## 2020-07-28 DIAGNOSIS — Z7689 Persons encountering health services in other specified circumstances: Secondary | ICD-10-CM

## 2020-07-28 NOTE — Telephone Encounter (Signed)
Copied from [place] [id number]. Topic: General - Other >> Jul 28, 2020 11:16 AM Pawlus, Brayton Layman A wrote: Reason for CRM: Pt wanted to find out if Kristeen Miss still wanted to continue her on phentermine (ADIPEX-P) 37.5 MG tablet, she stated she went to the pharmacy but it was denied, please follow up.

## 2020-07-28 NOTE — Telephone Encounter (Signed)
Pt states u told her to take 1/2 pill when first started and then to increase to a whole pill.  Pt is out will you refill has a f/u on 5/17

## 2020-07-28 NOTE — Telephone Encounter (Signed)
Pt called upset about med refill being refused  Last in person OV was in Oct: A&P from Jan 18, 2020:  1. Class 1 obesity with body mass index (BMI) of 34.0 to 34.9 in adult, unspecified obesity type, unspecified whether serious comorbidity present  E66.9    Z68.34    down 11 lbs, encouraged her to try full tablet more often, increase exercise and continue to work on healthy diet habits  2. Encounter for weight management  Z76.89 phentermine (ADIPEX-P) 37.5 MG tablet   Still tolerating phentermine taking half tablet some days and full tablet other days, still experiencing dry mouth SE, heart rate and blood pressure normal  3. Gastroesophageal reflux disease, unspecified whether esophagitis present  K21.9    some bloating indigeston  4. Encounter for weight management  Z76.89 phentermine (ADIPEX-P) 37.5 MG tablet   controlled substance database reviewed, pt tolerating phentermine - try full tablet, continue diet/lifestyle/exercise efforts  5. Primary hypertension  I10    Stable, well-controlled with diet and lifestyle, HCTZ used as needed for edema, blood pressure at goal today  6. Bilateral leg edema  R60.0    Symptoms gradually improving, low-salt diet encouraged, avoid stasis and immobility, try to avoid HCTZ use    Reviewed phentermine use with the patient encouraged her to stop using if ineffective, reviewed how it is not safe or effective if used for more than 4 months in a row.     Delsa Grana, PA-C 01/18/20 2:13 PM  She did virtual f/up in Feb this year: HPI: Pt presents for weight loss She has been off phentermine for the past couple months and she has maintained her weight and even lost 3 pounds She has only taking 2 out of the 3 prescriptions that were given to her from last July till last November.  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 Encounter for weight management  Z76.89 phentermine (ADIPEX-P) 37.5 MG tablet   controlled substance database reviewed, pt tolerating phentermine - took several month breat, restart, continue diet/lifestyle/exercise efforts  2. Class 1 obesity with body mass index (BMI) of 34.0 to 34.9 in adult, unspecified obesity type, unspecified whether serious comorbidity present  E66.9    Z68.34    weight stable, down 3 lbs - continue healthy habits, diet, exercise, we will continue to monitor and support    Return for 6-8 week f/up in office for weight/med check. Delsa Grana, PA-C 05/22/20 11:04 AM   She stated in Feb that she had a remaining Rx from last year - an additional Rx was given on 05/22/20 and she was asked to f/up in office in 6-8 weeks - which is adequate if she had #60 pills and was resuming taking meds.  Controlled substance database reviewed: 06/05/2020  05/22/2020   1  Phentermine 37.5 Mg Tablet  30.00  30  [place]  [id number]  [place] [phone])  0/0   Private Pay  [facility]    02/29/2020  01/18/2020   1  Phentermine 37.5 Mg Tablet  30.00  30  [place]  [id number]  [place] [phone])  0/0   Private Pay  [place]    12/16/2019  12/16/2019   1  Phentermine 37.5 Mg Tablet  30.00  30  [place]  [id number]  [place] [phone])  0/0   Private Pay  [place]    10/29/2019  10/29/2019   1  Phentermine 37.5 Mg Tablet  30.00  30  [place]  [id number]  [place] ([phone])         The patient cancelled her f/up appt on 07/25/20 - 9 weeks after last appt Has requested refills x2 this week and is upset that she is out of medications.   Name: Christy Craig, Christy Craig MRN: [id number]  Date: 07/25/2020 Status: Can  Time:  11:20 AM Length: 20  Visit Type: OFFICE VISIT [1004] Copay: $0.00  Provider: Delsa Grana, PA-C Department: [facility]  Referring Provider: Delsa Grana CSN: [id number]  Notes: Return for 6-8 week f/up in office for weight/med check.  Made On: Canceled: 05/22/2020 2:23 PM 07/20/2020 2:08 PM By: By: [place] Old, KAREN A  Cancel Rsn: Patient   Pt made f/up appt outside of the recommended time then cancelled her appt in advance on 07/20/20.  Running out of meds is due to pt non-compliance and not due to me withholding controlled substances.  Issue will be referred to clinic admin to review and reach out to pt.  Delsa Grana, PA-C 07/28/20  12:37 PM

## 2020-07-28 NOTE — Telephone Encounter (Signed)
for her last visit she told me she had only filled 2/3 past prescriptions - so she was going to fill the old one - the third one, then I gave her another one- and she hadn't had an in office appt since last year - so she can't get more w/out coming into the office for that Rx unfortunately - it was based on what she said and the fact that she didn't do an in office appt

## 2020-08-01 DIAGNOSIS — Z6834 Body mass index (BMI) 34.0-34.9, adult: Secondary | ICD-10-CM | POA: Diagnosis not present

## 2020-08-01 DIAGNOSIS — M25561 Pain in right knee: Secondary | ICD-10-CM | POA: Diagnosis not present

## 2020-08-15 ENCOUNTER — Ambulatory Visit: Payer: BC Managed Care – PPO | Admitting: Family Medicine

## 2020-08-15 ENCOUNTER — Other Ambulatory Visit: Payer: Self-pay

## 2020-08-15 ENCOUNTER — Encounter: Payer: Self-pay | Admitting: Family Medicine

## 2020-08-15 VITALS — BP 116/84 | HR 84 | Temp 99.2°F | Resp 16 | Ht 65.0 in | Wt 208.4 lb

## 2020-08-15 DIAGNOSIS — A6004 Herpesviral vulvovaginitis: Secondary | ICD-10-CM

## 2020-08-15 DIAGNOSIS — E785 Hyperlipidemia, unspecified: Secondary | ICD-10-CM

## 2020-08-15 DIAGNOSIS — Z1159 Encounter for screening for other viral diseases: Secondary | ICD-10-CM

## 2020-08-15 DIAGNOSIS — I10 Essential (primary) hypertension: Secondary | ICD-10-CM | POA: Diagnosis not present

## 2020-08-15 DIAGNOSIS — Z7689 Persons encountering health services in other specified circumstances: Secondary | ICD-10-CM

## 2020-08-15 DIAGNOSIS — Z1231 Encounter for screening mammogram for malignant neoplasm of breast: Secondary | ICD-10-CM

## 2020-08-15 DIAGNOSIS — Z760 Encounter for issue of repeat prescription: Secondary | ICD-10-CM

## 2020-08-15 DIAGNOSIS — K219 Gastro-esophageal reflux disease without esophagitis: Secondary | ICD-10-CM

## 2020-08-15 MED ORDER — VALACYCLOVIR HCL 500 MG PO TABS: 500.0000 mg | ORAL_TABLET | Freq: Every day | ORAL | 11 refills | Status: AC | PRN

## 2020-08-15 MED ORDER — HYDROCHLOROTHIAZIDE 12.5 MG PO TABS: 12.5000 mg | ORAL_TABLET | Freq: Every day | ORAL | 3 refills | Status: AC

## 2020-08-15 MED ORDER — PANTOPRAZOLE SODIUM 20 MG PO TBEC: 20.0000 mg | DELAYED_RELEASE_TABLET | ORAL | 5 refills | Status: AC

## 2020-08-15 MED ORDER — PHENTERMINE HCL 37.5 MG PO TABS: 37.5000 mg | ORAL_TABLET | Freq: Every day | ORAL | 0 refills | Status: AC

## 2020-08-15 NOTE — Progress Notes (Signed)
Patient ID: Christy Craig, female    DOB: 06/09/1965, 55 y.o.   MRN: [id number]  PCP: Delsa Grana, PA-C  Chief Complaint  Patient presents with  . Obesity    Subjective:   Christy Craig is a 55 y.o. female, presents to clinic with CC of the following:  HPI    Here for weight and med recheck-   She has one pill left Last phentermine fill March 7th, last appt 2/21- only 2 lbs difference, Not much weight gain, still working 2 jobs, trying to be active when not exhausted from work, still working on [employer] Readings from Last 10 Encounters:  08/15/20 208 lb 6.4 oz (94.5 kg)  05/22/20 206 lb (93.4 kg)  01/18/20 209 lb (94.8 kg)  12/16/19 212 lb (96.2 kg)  11/26/19 220 lb (99.8 kg)  10/29/19 (!) 220 lb (99.8 kg)  07/27/19 220 lb 12.8 oz (100.2 kg)  06/03/18 217 lb (98.4 kg)  12/30/17 217 lb (98.4 kg)  03/28/16 205 lb (93 kg)   BMI Readings from Last 5 Encounters:  08/15/20 34.68 kg/m  05/22/20 34.28 kg/m  01/18/20 34.78 kg/m  12/16/19 35.28 kg/m  11/26/19 36.61 kg/m   BP Readings from Last 3 Encounters:  08/15/20 116/84  05/22/20 135/83  01/18/20 118/84   Pulse Readings from Last 3 Encounters:  08/15/20 84  01/18/20 82  12/16/19 82   Prior communication with pt -  Pt called upset about med refill being refused  Last in person OV was in Oct: A&P from Jan 18, 2020:  1. Class 1 obesity with body mass index (BMI) of 34.0 to 34.9 in adult, unspecified obesity type, unspecified whether serious comorbidity present  E66.9    Z68.34    down 11 lbs, encouraged her to try full tablet more often, increase exercise and continue to work on healthy diet habits  2. Encounter for weight management  Z76.89 phentermine (ADIPEX-P) 37.5 MG tablet   Still tolerating phentermine taking half tablet some days and full tablet other days, still experiencing dry mouth SE, heart rate and blood pressure normal  3. Gastroesophageal reflux disease, unspecified whether esophagitis  present  K21.9    some bloating indigeston  4. Encounter for weight management  Z76.89 phentermine (ADIPEX-P) 37.5 MG tablet   controlled substance database reviewed, pt tolerating phentermine - try full tablet, continue diet/lifestyle/exercise efforts  5. Primary hypertension  I10    Stable, well-controlled with diet and lifestyle, HCTZ used as needed for edema, blood pressure at goal today  6. Bilateral leg edema  R60.0    Symptoms gradually improving, low-salt diet encouraged, avoid stasis and immobility, try to avoid HCTZ use    Reviewed phentermine use with the patient encouraged her to stop using if ineffective, reviewed how it is not safe or effective if used for more than 4 months in a row.     Delsa Grana, PA-C 01/18/20 2:13 PM  She did virtual f/up in Feb this year: HPI: Pt presents for weight loss She has been off phentermine for the past couple months and she has maintained her weight and even lost 3 pounds She has only taking 2 out of the 3 prescriptions that were given to her from last July till last November.  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 Encounter for weight management  Z76.89 phentermine (ADIPEX-P) 37.5 MG tablet   controlled substance database reviewed, pt tolerating phentermine - took several month breat, restart, continue diet/lifestyle/exercise efforts  2. Class 1 obesity with body mass index (BMI) of 34.0 to 34.9 in adult, unspecified obesity type, unspecified whether serious  comorbidity present  E66.9    Z68.34    weight stable, down 3 lbs - continue healthy habits, diet, exercise, we will continue to monitor and support    Return for 6-8 week f/up in office for weight/med check. Delsa Grana, PA-C 05/22/20 11:04 AM   She stated in Feb that she had a remaining Rx from last year - an additional Rx was given on 05/22/20 and she was asked to f/up in office in 6-8 weeks - which is adequate if she had #60 pills and was resuming taking meds.  Controlled substance database reviewed: 06/05/2020  05/22/2020   1  Phentermine 37.5 Mg Tablet  30.00  30  [place]  [id number]  [place] [phone])  0/0   Private Pay  [place]    02/29/2020  01/18/2020   1  Phentermine 37.5 Mg Tablet  30.00  30  [place]  [id number]  [place] [phone])  0/0   Private Pay  [place]    12/16/2019  12/16/2019   1  Phentermine 37.5 Mg Tablet  30.00  30  [place]  [id number]  [place] [phone])  0/0   Private Pay  [place]    10/29/2019  10/29/2019   1  Phentermine 37.5 Mg Tablet  30.00  30  [place]  [id number]  [place] [id number])         The patient cancelled her f/up appt on 07/25/20 - 9 weeks after last appt Has requested refills x2 this week and is upset that she is out of medications.   Name: Christy, Craig MRN: [id number]  Date: 07/25/2020 Status: Can  Time: 11:20 AM Length: 20  Visit Type: OFFICE VISIT [1004] Copay: $0.00  Provider: Delsa Grana, PA-C Department: [facility]  Referring Provider: Delsa Grana CSN: [id number]  Notes: Return for 6-8 week f/up in office for weight/med check.  Made On: Canceled: 05/22/2020 2:23 PM 07/20/2020 2:08 PM By: By: Marquette Old, KAREN A  Cancel Rsn: Patient   Pt made f/up appt outside of the recommended time then cancelled her appt in advance on 07/20/20.  Running out of meds is due to pt non-compliance and not due to me withholding controlled substances.  Issue will be referred to clinic admin to review and reach out to pt.  Delsa Grana, PA-C 07/28/20  12:37  PM    Hypertension:  Currently managed on HCTZ - pt uses more for bloating?  Not consistent daily use for HTN - BP at goal today  BP Readings from Last 3 Encounters:  08/15/20 116/84  05/22/20 135/83  01/18/20 118/84   Pt denies CP, SOB, exertional sx, LE edema, palpitation, Ha's, visual disturbances, lightheadedness, hypotension, syncope.  Refill on valtrex needed/requested - uses for outbreaks, she is stressed and is prone to outbreaks with stress  Hyperlipidemia: Not on meds Last  Lipids: Lab Results  Component Value Date   CHOL 213 (H) 07/27/2019   HDL 70 07/27/2019   LDLCALC 125 (H) 07/27/2019   TRIG 82 07/27/2019   CHOLHDL 3.0 07/27/2019  The 10-year ASCVD risk score Mikey Bussing DC Jr., et al., 2013) is: 2.2%   Values used to calculate the score:     Age: 56 years     Sex: Female     Is Non-Hispanic African American: Yes     Diabetic: No     Tobacco smoker: No     Systolic Blood Pressure: 99991111 mmHg     Is BP treated: Yes     HDL Cholesterol: 70 mg/dL     Total Cholesterol: 213 mg/dL  - Denies: Chest pain, shortness of breath, myalgias, claudication  GERD - on protonix, she is taking daily, feels abdominal bloating w/o meds  Patient Active Problem List   Diagnosis Date Noted  . Epigastric fullness 11/26/2019  . 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 Hypertension 11/19/2017  . Fibroids 07/29/2012  . Hiatal hernia 07/29/2012  . Enteritis 07/27/2012  . Menorrhagia 07/10/2012  . Abdominal pain 06/24/2012  . Hypokalemia 06/24/2012  . Essential hypertension, benign 06/24/2012  . Class 2 severe obesity with serious comorbidity and body mass index (BMI) of 35.0 to 35.9 in adult [facility]) 06/24/2012  . UTI (urinary tract infection) 06/24/2012  . Hyperglycemia 06/24/2012      Current Outpatient Medications:  .  hydrochlorothiazide (HYDRODIURIL) 12.5 MG tablet, Take 1 tablet (12.5 mg total) by mouth daily., Disp: 90 tablet, Rfl: 3 .  pantoprazole (PROTONIX) 20 MG  tablet, Take 1 tablet (20 mg total) by mouth every morning., Disp: 30 tablet, Rfl: 5 .  valACYclovir (VALTREX) 500 MG tablet, Take 1 tablet (500 mg total) by mouth daily as needed (for flares)., Disp: 90 tablet, Rfl: 1 .  phentermine (ADIPEX-P) 37.5 MG tablet, Take 1 tablet (37.5 mg total) by mouth daily before breakfast. (Patient not taking: Reported on 08/15/2020), Disp: 30 tablet, Rfl: 0   Allergies  Allergen Reactions  . Clindamycin     Other reaction(s): GI Problems  . Lisinopril Cough     Social History   Tobacco Use  . Smoking status: Never Smoker  . Smokeless tobacco: Never Used  Vaping Use  . Vaping Use: Never used  Substance Use Topics  . Alcohol use: No    Comment: social   . Drug use: No      Chart Review Today: I personally reviewed active problem list, medication list, allergies, family history, social history, health maintenance, notes from last encounter, lab results, imaging with the patient/caregiver today.   Review of Systems  Constitutional: Negative.   HENT: Negative.   Eyes: Negative.   Respiratory: Negative.   Cardiovascular: Negative.   Gastrointestinal: Negative.   Endocrine: Negative.   Genitourinary: Negative.   Musculoskeletal: Negative.   Skin: Negative.   Allergic/Immunologic: Negative.   Neurological: Negative.   Hematological: Negative.   Psychiatric/Behavioral: Negative.   All other systems reviewed and are negative.      Objective:   Vitals:   08/15/20 1110  Pulse: 84  Resp: 16  Temp: 99.2 F (37.3 C)  SpO2: 99%  Weight: 208 lb 6.4 oz (94.5 kg)  Height: 5\' 5"  (1.651 m)    Body mass index is 34.68 kg/m.  Physical Exam Vitals and nursing note reviewed.  Constitutional:      General: She is not in acute distress.    Appearance: Normal appearance. She  is well-developed. She is not ill-appearing, toxic-appearing or diaphoretic.     Interventions: Face mask in place.  HENT:     Head: Normocephalic and atraumatic.      Right Ear: External ear normal.     Left Ear: External ear normal.  Eyes:     General: Lids are normal. No scleral icterus.       Right eye: No discharge.        Left eye: No discharge.     Conjunctiva/sclera: Conjunctivae normal.  Neck:     Trachea: Phonation normal. No tracheal deviation.  Cardiovascular:     Rate and Rhythm: Normal rate and regular rhythm.     Pulses: Normal pulses.          Radial pulses are 2+ on the right side and 2+ on the left side.       Posterior tibial pulses are 2+ on the right side and 2+ on the left side.     Heart sounds: Normal heart sounds. No murmur heard. No friction rub. No gallop.   Pulmonary:     Effort: Pulmonary effort is normal. No respiratory distress.     Breath sounds: Normal breath sounds. No stridor. No wheezing, rhonchi or rales.  Chest:     Chest wall: No tenderness.  Abdominal:     General: Bowel sounds are normal. There is no distension.     Palpations: Abdomen is soft.     Comments: Obese, soft, NTND  Musculoskeletal:     Right lower leg: No edema.     Left lower leg: No edema.  Skin:    General: Skin is warm and dry.     Coloration: Skin is not jaundiced or pale.     Findings: No rash.  Neurological:     Mental Status: She is alert.     Motor: No abnormal muscle tone.     Gait: Gait normal.  Psychiatric:        Mood and Affect: Mood normal.        Speech: Speech normal.        Behavior: Behavior normal.      Results for orders placed or performed in visit on 07/27/19  CBC with Differential/Platelet  Result Value Ref Range   WBC 6.4 3.8 - 10.8 Thousand/uL   RBC 4.35 3.80 - 5.10 Million/uL   Hemoglobin 12.7 11.7 - 15.5 g/dL   HCT 36.8 35.0 - 45.0 %   MCV 84.6 80.0 - 100.0 fL   MCH 29.2 27.0 - 33.0 pg   MCHC 34.5 32.0 - 36.0 g/dL   RDW 13.1 11.0 - 15.0 %   Platelets 266 140 - 400 Thousand/uL   MPV 11.8 7.5 - 12.5 fL   Neutro Abs 3,718 1,500 - 7,800 cells/uL   Lymphs Abs 2,163 850 - 3,900 cells/uL   Absolute  Monocytes 448 200 - 950 cells/uL   Eosinophils Absolute 58 15 - 500 cells/uL   Basophils Absolute 13 0 - 200 cells/uL   Neutrophils Relative % 58.1 %   Total Lymphocyte 33.8 %   Monocytes Relative 7.0 %   Eosinophils Relative 0.9 %   Basophils Relative 0.2 %  COMPLETE METABOLIC PANEL WITH GFR  Result Value Ref Range   Glucose, Bld 96 65 - 99 mg/dL   BUN 14 7 - 25 mg/dL   Creat 0.73 0.50 - 1.05 mg/dL   GFR, Est Non African American 95 > OR = 60 mL/min/1.36m2   GFR, Est African American  111 > OR = 60 mL/min/1.42m2   BUN/Creatinine Ratio NOT APPLICABLE 6 - 22 (calc)   Sodium 140 135 - 146 mmol/L   Potassium 4.1 3.5 - 5.3 mmol/L   Chloride 104 98 - 110 mmol/L   CO2 29 20 - 32 mmol/L   Calcium 9.0 8.6 - 10.4 mg/dL   Total Protein 7.0 6.1 - 8.1 g/dL   Albumin 4.3 3.6 - 5.1 g/dL   Globulin 2.7 1.9 - 3.7 g/dL (calc)   AG Ratio 1.6 1.0 - 2.5 (calc)   Total Bilirubin 1.0 0.2 - 1.2 mg/dL   Alkaline phosphatase (APISO) 66 37 - 153 U/L   AST 16 10 - 35 U/L   ALT 16 6 - 29 U/L  Lipid panel  Result Value Ref Range   Cholesterol 213 (H) <200 mg/dL   HDL 70 > OR = 50 mg/dL   Triglycerides 82 <150 mg/dL   LDL Cholesterol (Calc) 125 (H) mg/dL (calc)   Total CHOL/HDL Ratio 3.0 <5.0 (calc)   Non-HDL Cholesterol (Calc) 143 (H) <130 mg/dL (calc)  TSH  Result Value Ref Range   TSH 1.55 mIU/L       Assessment & Plan:     ICD-10-CM   1. Essential hypertension, benign  I10 hydrochlorothiazide (HYDRODIURIL) 12.5 MG tablet    CBC with Differential/Platelet    COMPLETE METABOLIC PANEL WITH GFR   on and off HCTZ, pt prefers to go back on, helps with "fluid retention" some mild edema to b/l LE, discussed low salt diet, BP good today  2. Encounter for weight management  Z76.89 phentermine (ADIPEX-P) 37.5 MG tablet    CBC with Differential/Platelet    COMPLETE METABOLIC PANEL WITH GFR    Lipid panel   last Rx, pt thought she needed 1 month break, explained this will be last refill, not a long  term solution, needs weight management/dietician etc   3. Herpes simplex vulvovaginitis  A60.04 valACYclovir (VALTREX) 500 MG tablet   refill on valtrex  4. Gastroesophageal reflux disease, unspecified whether esophagitis present  K21.9    protonix refilled, sx without meds  5. Hyperlipidemia, unspecified hyperlipidemia type  E10.0 COMPLETE METABOLIC PANEL WITH GFR    Lipid panel   high cholesterol with 2.2% ASCVD risk, not on meds, labs over a year ago, recheck  6. Encounter for hepatitis C screening test for low risk patient  Z11.59 Hepatitis C antibody  7. Encounter for screening mammogram for malignant neoplasm of breast  Z12.31 MM 3D SCREEN BREAST BILATERAL  8. Medication refill  Z76.0 pantoprazole (PROTONIX) 20 MG tablet         Delsa Grana, PA-C 08/15/20 11:18 AM

## 2020-08-16 LAB — CBC WITH DIFFERENTIAL/PLATELET
Absolute Monocytes: 518 cells/uL (ref 200–950)
Basophils Absolute: 23 cells/uL (ref 0–200)
Basophils Relative: 0.3 %
Eosinophils Absolute: 53 cells/uL (ref 15–500)
Eosinophils Relative: 0.7 %
HCT: 34.6 % — ABNORMAL LOW (ref 35.0–45.0)
Hemoglobin: 11.8 g/dL (ref 11.7–15.5)
Lymphs Abs: 2378 cells/uL (ref 850–3900)
MCH: 29.1 pg (ref 27.0–33.0)
MCHC: 34.1 g/dL (ref 32.0–36.0)
MCV: 85.4 fL (ref 80.0–100.0)
MPV: 11.3 fL (ref 7.5–12.5)
Monocytes Relative: 6.9 %
Neutro Abs: 4530 cells/uL (ref 1500–7800)
Neutrophils Relative %: 60.4 %
Platelets: 268 10*3/uL (ref 140–400)
RBC: 4.05 10*6/uL (ref 3.80–5.10)
RDW: 14.3 % (ref 11.0–15.0)
Total Lymphocyte: 31.7 %
WBC: 7.5 10*3/uL (ref 3.8–10.8)

## 2020-08-16 LAB — COMPLETE METABOLIC PANEL WITH GFR
AG Ratio: 1.6 (calc) (ref 1.0–2.5)
ALT: 12 U/L (ref 6–29)
AST: 13 U/L (ref 10–35)
Albumin: 4.1 g/dL (ref 3.6–5.1)
Alkaline phosphatase (APISO): 63 U/L (ref 37–153)
BUN: 12 mg/dL (ref 7–25)
CO2: 26 mmol/L (ref 20–32)
Calcium: 8.8 mg/dL (ref 8.6–10.4)
Chloride: 104 mmol/L (ref 98–110)
Creat: 0.74 mg/dL (ref 0.50–1.05)
GFR, Est African American: 108 mL/min/{1.73_m2} (ref >=60)
GFR, Est Non African American: 93 mL/min/{1.73_m2} (ref >=60)
Globulin: 2.6 g/dL (calc) (ref 1.9–3.7)
Glucose, Bld: 88 mg/dL (ref 65–99)
Potassium: 4.3 mmol/L (ref 3.5–5.3)
Sodium: 137 mmol/L (ref 135–146)
Total Bilirubin: 0.9 mg/dL (ref 0.2–1.2)
Total Protein: 6.7 g/dL (ref 6.1–8.1)

## 2020-08-16 LAB — LIPID PANEL
Cholesterol: 204 mg/dL — ABNORMAL HIGH (ref <200)
HDL: 74 mg/dL (ref >=50)
LDL Cholesterol (Calc): 109 mg/dL (calc) — ABNORMAL HIGH
Non-HDL Cholesterol (Calc): 130 mg/dL (calc) — ABNORMAL HIGH (ref <130)
Total CHOL/HDL Ratio: 2.8 (calc) (ref <5.0)
Triglycerides: 105 mg/dL (ref <150)

## 2020-08-16 LAB — HEPATITIS C ANTIBODY
Hepatitis C Ab: NONREACTIVE
SIGNAL TO CUT-OFF: 0 (ref <1.00)

## 2020-08-17 ENCOUNTER — Encounter: Payer: Self-pay | Admitting: Family Medicine

## 2020-11-16 DIAGNOSIS — H6501 Acute serous otitis media, right ear: Secondary | ICD-10-CM | POA: Diagnosis not present

## 2020-11-16 DIAGNOSIS — J0140 Acute pansinusitis, unspecified: Secondary | ICD-10-CM | POA: Diagnosis not present

## 2020-11-20 ENCOUNTER — Other Ambulatory Visit: Payer: Self-pay

## 2020-11-20 ENCOUNTER — Ambulatory Visit (INDEPENDENT_AMBULATORY_CARE_PROVIDER_SITE_OTHER): Payer: BC Managed Care – PPO | Admitting: Family Medicine

## 2020-11-20 ENCOUNTER — Encounter: Payer: Self-pay | Admitting: Family Medicine

## 2020-11-20 VITALS — Wt 205.0 lb

## 2020-11-20 DIAGNOSIS — M549 Dorsalgia, unspecified: Secondary | ICD-10-CM

## 2020-11-20 MED ORDER — CYCLOBENZAPRINE HCL 5 MG PO TABS: 5.0000 mg | ORAL_TABLET | Freq: Three times a day (TID) | ORAL | 0 refills | Status: AC | PRN

## 2020-11-20 NOTE — Patient Instructions (Signed)
It was great to see you!  Our plans for today:  - Take the muscle relaxers as needed. Try ibuprofen and heating pad also.  - If you are still having pain after trying, come for an in-person appointment so we can take a look.  - Try stretches below to help.  Take care and seek immediate care sooner if you develop any concerns.   Dr. Romilda Garret on your back. Bend your right knee so that your right foot is flat on the floor. Cross your left leg over your right so that your left ankle rests on your right knee. Use your hands to grab hold of your left knee and pull it gently toward the opposite shoulder. You should feel the stretch in your buttocks and hips. Hold for 15 to 30 seconds. Relax, and then repeat with the other leg. Repeat this cycle 2 to 4 times.   Lie on your back in a doorway, with one leg through the open door. Slide your leg up the wall to straighten your knee. You should feel a gentle stretch down the back of your leg. Hold the stretch for at least 1 minute. As the days go by, add a little more time until you can relax and let these muscles stretch for as much as 6 minutes for each leg. Do not arch your back. Do not bend either knee. Keep one heel touching the floor and the other heel touching the wall. Do not point your toes. Repeat with your other leg. Do 2 to 4 times for each leg. If you do not have a place to do this exercise in a doorway, there is another way to do it:  Lie on your back and bend the knee of the leg you want to stretch. Loop a towel under the ball and toes of that foot, and hold the ends of the towel in your hands. Straighten your knee and slowly pull back on the towel. You should feel a gentle stretch down the back of your leg. It is hard to hold this stretch with a towel for a long time, but hold the stretch for at least 15 to 30 seconds. One minute or more is even better. Repeat with your other leg. Do 2 to 4 times for each leg.   Child's  Pose Kneel on the floor with your toes together and your knees hip-width apart. Rest your palms on top of your thighs. On an exhale, lower your torso between your knees. Extend your arms alongside your torso with your palms facing down. Relax your shoulders toward the ground. Rest in the pose for as long as needed.

## 2020-11-20 NOTE — Progress Notes (Signed)
Virtual Visit Note  I connected with Christy Craig on 11/20/20 at 11:40 AM EDT by phone and verified that I am speaking with the correct person using two identifiers.  Location: Patient: work Provider: [facility]   I discussed the limitations of evaluation and management by telemedicine and the availability of in person appointments. The patient expressed understanding and agreed to proceed.  History of Present Illness:  BACK PAIN - woke this morning with back pain, felt like spasm (locking up). H/o cervical spasms, feels similar.  - nauseous, no vomiting.  - currently on antibiotic for ear infection.  - no trouble with urination. Has had some loose stools but attributes to antibiotic.  Duration:  1 day Mechanism of injury: no trauma Location: Right whole back Onset:  when waking Quality: dull Frequency: constant Radiation: none Aggravating factors:  walking or bending a certain way , deep breath Alleviating factors: nothing Status: worse Treatments attempted: none  Relief with NSAIDs?: No NSAIDs Taken Nighttime pain:  no Paresthesias / decreased sensation:  no Bowel / bladder incontinence:  no Fevers:  no Dysuria / urinary frequency:  no    Observations/Objective:  Patient had trouble connecting to video visit, entirety of visit conducted over the phone.  Speaks in full sentences, no respiratory distress.   Assessment and Plan:  Back Pain Consistent with MSK etiology though unclear inciting event, possible to have slept in awkward position leading to pain given timing of pain. No red flags. Will trial short course of muscle relaxer. Recommend NSAIDs, heating pad, stretching. If pain no better after trying, recommend in person appt for evaluation.      I discussed the assessment and treatment plan with the patient. The patient was provided an opportunity to ask questions and all were answered. The patient agreed with the plan and demonstrated an understanding of the  instructions.   The patient was advised to call back or seek an in-person evaluation if the symptoms worsen or if the condition fails to improve as anticipated.  I provided 10 minutes of non-face-to-face time during this encounter.   Myles Gip, DO

## 2021-01-16 ENCOUNTER — Encounter: Payer: BC Managed Care – PPO | Admitting: Family Medicine

## 2021-01-24 DIAGNOSIS — I10 Essential (primary) hypertension: Secondary | ICD-10-CM | POA: Diagnosis not present

## 2021-01-24 DIAGNOSIS — R300 Dysuria: Secondary | ICD-10-CM | POA: Diagnosis not present

## 2021-07-09 ENCOUNTER — Other Ambulatory Visit: Payer: Self-pay

## 2021-07-09 DIAGNOSIS — Z1231 Encounter for screening mammogram for malignant neoplasm of breast: Secondary | ICD-10-CM

## 2022-04-04 ENCOUNTER — Other Ambulatory Visit: Payer: Self-pay | Admitting: Family Medicine

## 2022-04-04 DIAGNOSIS — Z1231 Encounter for screening mammogram for malignant neoplasm of breast: Secondary | ICD-10-CM

## 2022-04-24 ENCOUNTER — Ambulatory Visit
Admission: RE | Admit: 2022-04-24 | Discharge: 2022-04-24 | Disposition: A | Discharge status: Home / Self Care | Payer: 59 | Source: Ambulatory Visit | Attending: Family Medicine | Admitting: Family Medicine

## 2022-04-24 DIAGNOSIS — Z1231 Encounter for screening mammogram for malignant neoplasm of breast: Secondary | ICD-10-CM | POA: Insufficient documentation

## 2022-05-01 ENCOUNTER — Other Ambulatory Visit: Payer: Self-pay | Admitting: *Deleted

## 2022-05-01 ENCOUNTER — Inpatient Hospital Stay
Admission: RE | Admit: 2022-05-01 | Discharge: 2022-05-01 | Disposition: A | Discharge status: Home / Self Care | Payer: Self-pay | Source: Ambulatory Visit | Attending: *Deleted | Admitting: *Deleted

## 2022-05-01 DIAGNOSIS — Z1231 Encounter for screening mammogram for malignant neoplasm of breast: Secondary | ICD-10-CM

## 2023-04-18 ENCOUNTER — Ambulatory Visit
Admission: EM | Admit: 2023-04-18 | Discharge: 2023-04-18 | Disposition: A | Discharge status: Home / Self Care | Payer: Managed Care, Other (non HMO) | Attending: Nurse Practitioner | Admitting: Nurse Practitioner

## 2023-04-18 DIAGNOSIS — M25519 Pain in unspecified shoulder: Secondary | ICD-10-CM | POA: Diagnosis not present

## 2023-04-18 DIAGNOSIS — R6883 Chills (without fever): Secondary | ICD-10-CM | POA: Diagnosis not present

## 2023-04-18 LAB — POC COVID19/FLU A&B COMBO
Covid Antigen, POC: NEGATIVE
Influenza A Antigen, POC: NEGATIVE
Influenza B Antigen, POC: NEGATIVE

## 2023-04-18 NOTE — ED Triage Notes (Signed)
Pt reports she has a headache, shoulder blade and back pain, and chills x 3 weeks

## 2023-04-18 NOTE — Discharge Instructions (Addendum)
The COVID/flu test was negative.  I suspect symptoms may be coming from your UTI, although this could also be viral.  Recommend fluids, rest, and over-the-counter analgesics such as ibuprofen or Tylenol. Monitor symptoms for worsening.  If symptoms worsen, or fail to improve over the next several days, you may follow-up in this clinic or with your primary care physician for further evaluation. Follow-up as needed.

## 2023-04-18 NOTE — ED Provider Notes (Signed)
[facility]    CSN: [id number] Arrival date & time: 04/18/23  1413      History   Chief Complaint No chief complaint on file.   HPI Christy Craig is a 58 y.o. female.   The history is provided by the patient.   Patient presents for complaints of chills, neck pain, shoulder pain, and headache that started this morning.  Patient denies fever, chills, nasal congestion, runny nose, cough, chest pain, abdominal pain, nausea, vomiting, diarrhea, or rash.  Patient states that she was diagnosed with a UTI by her provider 1 day ago, but she has not started the medication.  She is concerned that her symptoms may be coming from her UTI.  Past Medical History:  Diagnosis Date   Hepatic cyst    Hiatal hernia    HSV (herpes simplex virus) infection    Hypertension    Uterine fibroid     Patient Active Problem List   Diagnosis Date Noted   Epigastric fullness 11/26/2019   Herpes simplex vulvovaginitis 12/30/2017   Hypertension 11/19/2017   Fibroids 07/29/2012   Hiatal hernia 07/29/2012   Enteritis 07/27/2012   Menorrhagia 07/10/2012   Abdominal pain 06/24/2012   Hypokalemia 06/24/2012   Essential hypertension, benign 06/24/2012   Class 2 severe obesity with serious comorbidity and body mass index (BMI) of 35.0 to 35.9 in adult [facility]) 06/24/2012   UTI (urinary tract infection) 06/24/2012   Hyperglycemia 06/24/2012    Past Surgical History:  Procedure Laterality Date   ABDOMINAL HYSTERECTOMY     CESAREAN SECTION     CYST EXCISION  1988   removed from buttock   CYST REMOVAL HAND  1991   PARTIAL HYSTERECTOMY  2019   TUBAL LIGATION      OB History     Gravida  3   Para  3   Term      Preterm      AB      Living  3      SAB      IAB      Ectopic      Multiple      Live Births  3            Home Medications    Prior to Admission medications   Medication Sig Start Date End Date Taking? Authorizing Provider  cyclobenzaprine (FLEXERIL)  5 MG tablet Take 1 tablet (5 mg total) by mouth 3 (three) times daily as needed for muscle spasms. 11/20/20   Caro Laroche, DO  hydrochlorothiazide (HYDRODIURIL) 12.5 MG tablet Take 1 tablet (12.5 mg total) by mouth daily. 08/15/20   Danelle Berry, PA-C  pantoprazole (PROTONIX) 20 MG tablet Take 1 tablet (20 mg total) by mouth every morning. 08/15/20   Danelle Berry, PA-C  phentermine (ADIPEX-P) 37.5 MG tablet Take 1 tablet (37.5 mg total) by mouth daily before breakfast. 08/15/20   Danelle Berry, PA-C  valACYclovir (VALTREX) 500 MG tablet Take 1 tablet (500 mg total) by mouth daily as needed (for flares). 08/15/20   Danelle Berry, PA-C    Family History Family History  Problem Relation Age of Onset   Kidney failure Father    Hypertension Sister    Migraines Sister    Ulcers Sister    Breast cancer Neg Hx     Social History Social History   Tobacco Use   Smoking status: Never   Smokeless tobacco: Never  Vaping Use   Vaping status: Never Used  Substance  Use Topics   Alcohol use: No    Comment: social    Drug use: No     Allergies   Clindamycin and Lisinopril   Review of Systems Review of Systems Per HPI  Physical Exam Triage Vital Signs ED Triage Vitals  Encounter Vitals Group     BP 04/18/23 1432 (!) 137/94     Systolic BP Percentile --      Diastolic BP Percentile --      Pulse Rate 04/18/23 1430 (!) 108     Resp 04/18/23 1430 20     Temp 04/18/23 1430 99.2 F (37.3 C)     Temp Source 04/18/23 1430 Oral     SpO2 04/18/23 1430 98 %     Weight --      Height --      Head Circumference --      Peak Flow --      Pain Score 04/18/23 1430 4     Pain Loc --      Pain Education --      Exclude from Growth Chart --    No data found.  Updated Vital Signs BP (!) 137/94 (BP Location: Right Arm)   Pulse (!) 108   Temp 99.2 F (37.3 C) (Oral)   Resp 20   LMP 03/28/2016 Comment: neg upreg  SpO2 98%   Visual Acuity Right Eye Distance:   Left Eye Distance:    Bilateral Distance:    Right Eye Near:   Left Eye Near:    Bilateral Near:     Physical Exam Vitals and nursing note reviewed.  Constitutional:      General: She is not in acute distress.    Appearance: Normal appearance.  HENT:     Head: Normocephalic.     Right Ear: Tympanic membrane, ear canal and external ear normal.     Left Ear: Tympanic membrane, ear canal and external ear normal.     Nose: Nose normal.     Mouth/Throat:     Mouth: Mucous membranes are moist.  Eyes:     Extraocular Movements: Extraocular movements intact.     Conjunctiva/sclera: Conjunctivae normal.     Pupils: Pupils are equal, round, and reactive to light.  Cardiovascular:     Rate and Rhythm: Regular rhythm. Tachycardia present.     Pulses: Normal pulses.     Heart sounds: Normal heart sounds.  Pulmonary:     Effort: Pulmonary effort is normal. No respiratory distress.     Breath sounds: Normal breath sounds. No stridor. No wheezing, rhonchi or rales.  Abdominal:     General: Bowel sounds are normal.     Palpations: Abdomen is soft.     Tenderness: There is no abdominal tenderness.  Musculoskeletal:     Cervical back: Normal range of motion.  Lymphadenopathy:     Cervical: No cervical adenopathy.  Skin:    General: Skin is warm and dry.  Neurological:     General: No focal deficit present.     Mental Status: She is alert and oriented to person, place, and time.  Psychiatric:        Mood and Affect: Mood normal.        Behavior: Behavior normal.     UC Treatments / Results  Labs (all labs ordered are listed, but only abnormal results are displayed) Labs Reviewed - No data to display  EKG   Radiology No results found.  Procedures Procedures (including critical care time)  Medications Ordered in UC Medications - No data to display  Initial Impression / Assessment and Plan / UC Course  I have reviewed the triage vital signs and the nursing notes.  Pertinent labs & imaging  results that were available during my care of the patient were reviewed by me and considered in my medical decision making (see chart for details).  The COVID/flu test was negative.  It is likely that patient is experiencing symptoms from her UTI v a viral illness.  She has not started the antibiotic for the UTI at this time.  Supportive care recommendations were provided and discussed with the patient to include fluids, rest, and over-the-counter analgesics.  Patient was in agreement with this plan of care and verbalized understanding.  All questions were answered.  Patient stable for discharge.   Final Clinical Impressions(s) / UC Diagnoses   Final diagnoses:  None   Discharge Instructions   None    ED Prescriptions   None    PDMP not reviewed this encounter.   Abran Cantor, NP 04/18/23 1555
